# Patient Record
Sex: Male | Born: 1995 | Race: White | Hispanic: No | Marital: Single | Smoking: Never smoker
Health system: Southern US, Community
[De-identification: ages and names within clinical notes are randomized; demographics above are authoritative.]

## PROBLEM LIST (undated history)

## (undated) DIAGNOSIS — E063 Autoimmune thyroiditis: Secondary | ICD-10-CM

## (undated) HISTORY — PX: HERNIA REPAIR: SHX51

## (undated) HISTORY — PX: INGUINAL HERNIA REPAIR: SUR1180

## (undated) HISTORY — PX: UMBILICAL HERNIA REPAIR: SHX196

---
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
## 2021-04-07 ENCOUNTER — Emergency Department (HOSPITAL_BASED_OUTPATIENT_CLINIC_OR_DEPARTMENT_OTHER)
Admission: EM | Admit: 2021-04-07 | Discharge: 2021-04-07 | Disposition: A | Discharge status: Home / Self Care | Payer: Commercial Managed Care - HMO | Attending: Emergency Medicine | Admitting: Emergency Medicine

## 2021-04-07 ENCOUNTER — Emergency Department (HOSPITAL_BASED_OUTPATIENT_CLINIC_OR_DEPARTMENT_OTHER): Payer: Commercial Managed Care - HMO

## 2021-04-07 ENCOUNTER — Other Ambulatory Visit: Payer: Self-pay

## 2021-04-07 ENCOUNTER — Encounter (HOSPITAL_BASED_OUTPATIENT_CLINIC_OR_DEPARTMENT_OTHER): Payer: Self-pay | Admitting: Emergency Medicine

## 2021-04-07 DIAGNOSIS — J45909 Unspecified asthma, uncomplicated: Secondary | ICD-10-CM | POA: Insufficient documentation

## 2021-04-07 DIAGNOSIS — R9431 Abnormal electrocardiogram [ECG] [EKG]: Secondary | ICD-10-CM | POA: Diagnosis not present

## 2021-04-07 DIAGNOSIS — Z20822 Contact with and (suspected) exposure to covid-19: Secondary | ICD-10-CM | POA: Diagnosis not present

## 2021-04-07 DIAGNOSIS — R0789 Other chest pain: Secondary | ICD-10-CM | POA: Insufficient documentation

## 2021-04-07 HISTORY — DX: Autoimmune thyroiditis: E06.3

## 2021-04-07 LAB — COMPREHENSIVE METABOLIC PANEL
ALT: 18 U/L (ref 0–44)
AST: 22 U/L (ref 15–41)
Albumin: 4.9 g/dL (ref 3.5–5.0)
Alkaline Phosphatase: 63 U/L (ref 38–126)
Anion gap: 10 (ref 5–15)
BUN: 17 mg/dL (ref 6–20)
CO2: 25 mmol/L (ref 22–32)
Calcium: 9.4 mg/dL (ref 8.9–10.3)
Chloride: 102 mmol/L (ref 98–111)
Creatinine, Ser: 1.03 mg/dL (ref 0.61–1.24)
GFR, Estimated: >60 mL/min (ref >60)
Glucose, Bld: 120 mg/dL — ABNORMAL HIGH (ref 70–99)
Potassium: 3.2 mmol/L — ABNORMAL LOW (ref 3.5–5.1)
Sodium: 137 mmol/L (ref 135–145)
Total Bilirubin: 0.9 mg/dL (ref 0.3–1.2)
Total Protein: 8.4 g/dL — ABNORMAL HIGH (ref 6.5–8.1)

## 2021-04-07 LAB — CBC WITH DIFFERENTIAL/PLATELET
Abs Immature Granulocytes: 0.02 10*3/uL (ref 0.00–0.07)
Basophils Absolute: 0 10*3/uL (ref 0.0–0.1)
Basophils Relative: 1 %
Eosinophils Absolute: 0.2 10*3/uL (ref 0.0–0.5)
Eosinophils Relative: 4 %
HCT: 45.5 % (ref 39.0–52.0)
Hemoglobin: 16.1 g/dL (ref 13.0–17.0)
Immature Granulocytes: 0 %
Lymphocytes Relative: 27 %
Lymphs Abs: 1.7 10*3/uL (ref 0.7–4.0)
MCH: 29.8 pg (ref 26.0–34.0)
MCHC: 35.4 g/dL (ref 30.0–36.0)
MCV: 84.1 fL (ref 80.0–100.0)
Monocytes Absolute: 0.6 10*3/uL (ref 0.1–1.0)
Monocytes Relative: 9 %
Neutro Abs: 3.8 10*3/uL (ref 1.7–7.7)
Neutrophils Relative %: 59 %
Platelets: 205 10*3/uL (ref 150–400)
RBC: 5.41 MIL/uL (ref 4.22–5.81)
RDW: 11.9 % (ref 11.5–15.5)
WBC: 6.4 10*3/uL (ref 4.0–10.5)
nRBC: 0 % (ref 0.0–0.2)

## 2021-04-07 LAB — RESP PANEL BY RT-PCR (FLU A&B, COVID) ARPGX2
Influenza A by PCR: NEGATIVE
Influenza B by PCR: NEGATIVE
SARS Coronavirus 2 by RT PCR: NEGATIVE

## 2021-04-07 LAB — TSH: TSH: 2.057 u[IU]/mL (ref 0.350–4.500)

## 2021-04-07 LAB — TROPONIN I (HIGH SENSITIVITY): Troponin I (High Sensitivity): 3 ng/L (ref <18)

## 2021-04-07 NOTE — Discharge Instructions (Signed)
Your work-up today was reassuring, no evidence of pneumonia or heart attack.  You should continue taking the Pepcid to see if that helps alleviate the symptoms, you can also take Tylenol.  Avoid any anti-inflammatory use as this can irritate the esophagus and may be reproducing your symptoms.  Follow-up with your primary care doctor next week for reevaluation.  Check MyChart for the result of COVID and flu.

## 2021-04-07 NOTE — ED Triage Notes (Signed)
Pt reports CP since Wed intermittently; sts now it feels like someone standing on his chest

## 2021-04-07 NOTE — ED Notes (Signed)
ED Provider at bedside. 

## 2021-04-07 NOTE — ED Provider Notes (Addendum)
[facility] EMERGENCY DEPARTMENT Provider Note   CSN: [id number] Arrival date & time: 04/07/21  1628     History  Chief Complaint  Patient presents with   Chest Pain    Nathan Beltran is a 26 y.o. male.   Chest Pain Associated symptoms: no back pain, no fever, no nausea and no vomiting   This is a 26 year old male with history of Hashimoto's and childhood asthma presenting due to chest pain.  Started a week ago, initially was intermittent but is been constant the last few days.  It feels like burning, unable to identify provoking features.  Does not radiate elsewhere, no associated nausea or vomiting or shortness of breath.  Did have a URI about a week before the symptoms started.  Has tried Pepcid which has not alleviated the symptoms.  Denies smoking cigarettes or vaping, no first-degree family members with CAD, not hypertensive, hyperlipidemia, no prior MIs or PEs.    Home Medications Prior to Admission medications   Medication Sig Start Date End Date Taking? Authorizing Provider  Budesonide (PULMICORT IN) Inhale into the lungs.      [provider]  Methylphenidate HCl (RITALIN PO) Take by mouth.      [provider]      Allergies    Penicillins    Review of Systems   Review of Systems  Constitutional:  Negative for fever.  Respiratory:  Negative for wheezing.   Cardiovascular:  Positive for chest pain.  Gastrointestinal:  Negative for nausea and vomiting.  Musculoskeletal:  Negative for back pain.  Skin:  Negative for wound.   Physical Exam Updated Vital Signs BP 134/68 (BP Location: Right Arm)    Pulse 78    Temp 98.4 F (36.9 C) (Oral)    Resp 16    Ht 5\' 5"  (1.651 m)    Wt 65.3 kg    SpO2 100%    BMI 23.96 kg/m  Physical Exam Vitals and nursing note reviewed. Exam conducted with a chaperone present.  Constitutional:      General: He is not in acute distress.    Appearance: Normal appearance.  HENT:     Head: Normocephalic and  atraumatic.  Eyes:     General: No scleral icterus.       Right eye: No discharge.        Left eye: No discharge.     Extraocular Movements: Extraocular movements intact.     Pupils: Pupils are equal, round, and reactive to light.  Cardiovascular:     Rate and Rhythm: Normal rate and regular rhythm.     Pulses: Normal pulses.     Heart sounds: Normal heart sounds. No murmur heard.   No friction rub. No gallop.  Pulmonary:     Effort: Pulmonary effort is normal. No respiratory distress.     Breath sounds: Normal breath sounds.  Chest:     Chest wall: No tenderness.  Abdominal:     General: Abdomen is flat. Bowel sounds are normal. There is no distension.     Palpations: Abdomen is soft.     Tenderness: There is no abdominal tenderness.  Skin:    General: Skin is warm and dry.     Coloration: Skin is not jaundiced.  Neurological:     Mental Status: He is alert. Mental status is at baseline.     Coordination: Coordination normal.    ED Results / Procedures / Treatments   Labs (all labs ordered are listed,  but only abnormal results are displayed) Labs Reviewed  COMPREHENSIVE METABOLIC PANEL - Abnormal; Notable for the following components:      Result Value   Potassium 3.2 (*)    Glucose, Bld 120 (*)    Total Protein 8.4 (*)    All other components within normal limits  RESP PANEL BY RT-PCR (FLU A&B, COVID) ARPGX2  CBC WITH DIFFERENTIAL/PLATELET  TSH  TROPONIN I (HIGH SENSITIVITY)    EKG EKG Interpretation  Date/Time:  Saturday April 07 2021 16:40:22 EST Ventricular Rate:  76 PR Interval:  146 QRS Duration: 90 QT Interval:  384 QTC Calculation: 432 R Axis:   99 Text Interpretation: Normal sinus rhythm with sinus arrhythmia Right atrial enlargement Rightward axis Pulmonary disease pattern Abnormal ECG When compared with ECG of 15-Jul-2006 15:42,  no significant change Confirmed by Meridee Score [phone]) on 04/07/2021 4:43:35 PM  Radiology DG Chest 2  View  Result Date: 04/07/2021 CLINICAL DATA:  Chest pain for 4 days. EXAM: CHEST - 2 VIEW COMPARISON:  None. FINDINGS: The heart size and mediastinal contours are within normal limits. Both lungs are clear. No evidence of pneumothorax or pleural effusion. The visualized skeletal structures are unremarkable. IMPRESSION: Normal exam. Electronically Signed   By: Danae Orleans M.D.   On: 04/07/2021 19:00    Procedures Procedures    Medications Ordered in ED Medications - No data to display  ED Course/ Medical Decision Making/ A&P                           Medical Decision Making Amount and/or Complexity of Data Reviewed Independent Historian: parent    Details: parent at bedside External Data Reviewed: ECG.    Details: nsr Labs:  Decision-making details documented in ED Course. Radiology:  Decision-making details documented in ED Course. ECG/medicine tests:  Decision-making details documented in ED Course.  Risk OTC drugs. Prescription drug management.   Patient is a 26 year old male presenting due to chest pain x1 week.  His vitals are stable, not febrile no tachycardia, tachypnea or hypoxia.  Physical exam is unremarkable.  EKG unchanged compared to previous, no ischemic findings.  PERC negative. Troponin low, doubt this is ACS and do not feel we need a second troponin.  Respiratory panel does not show any signs of flu or COVID, no leukocytosis or anemia.  BMP notable for mild hypokalemia at 3.2, not contributory to his current presentation.  Patient's mother requested TSH, this was ordered in triage.  Do not think his presentation is related to distant history of Hashimoto's, advised to check on MyChart for the results.  Do not think patient needs additional work-up at this time.  His pain sounds somewhat like reflux disease despite no improvement with Pepcid x1 week.  Does not appear to be cardiac in nature, doubt any infectious or emergent pathology at this time.  Return precautions  discussed, discharged in stable condition.  Advised to follow-up with PCP.        Final Clinical Impression(s) / ED Diagnoses Final diagnoses:  Atypical chest pain    Rx / DC Orders ED Discharge Orders     None         Theron Arista, PA-C 04/08/21 0000    Theron Arista, PA-C 04/08/21 0000    Terrilee Files, MD 04/08/21 1016

## 2021-04-24 ENCOUNTER — Ambulatory Visit: Payer: Managed Care, Other (non HMO) | Admitting: Internal Medicine

## 2021-04-24 ENCOUNTER — Other Ambulatory Visit: Payer: Self-pay

## 2021-04-24 VITALS — BP 122/70 | HR 86 | Ht 65.0 in | Wt 142.0 lb

## 2021-04-24 DIAGNOSIS — R0789 Other chest pain: Secondary | ICD-10-CM

## 2021-04-24 NOTE — Progress Notes (Signed)
Cardiology Office Note:    Date:  04/24/2021   ID:  Nathan Beltran, DOB [birth year], MRN [id number]  PCP:  Everrett Coombe, DO   [facility] Providers Cardiologist:  Maisie Fus, MD     Referring MD: No ref. provider found   Chief Complaint  Patient presents with   New Patient (Initial Visit)       Atypical cp  History of Present Illness:    Nathan Beltran is a 26 y.o. male with a hx of hashimoto thyroiditis, childhood asthma, seen in the ED for burning chest pain diagnosed with reflux, ACS was ruled out, cardiology referral was sent  Today he reports, he said he had a cold. This resolved. He ate food and had a sharp pain. He was diagnosed with reflux. He took pepcid [facility] which did not help. His mother thinks this could be MSK related with physical labor during the day. It has resolved.  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 No hx of congential heart disease.  He had asthma but this has improved overtime. He denies LH, dizziness or syncope.  Social Hx: non smoker  Family hx-MGM CHF in the 51s. Son had heart transplant at 86 2/2 ischemic heart disease. Mother and father are healthy. MGM has arrhythmia in her 61s.  EKG: NSR, right axis, RAE (c/f possible lead reversal)  Past Medical History:  Diagnosis Date   Hashimoto's disease     Past Surgical History:  Procedure Laterality Date   HERNIA REPAIR      Current Medications: No outpatient medications have been marked as taking for the 04/24/21 encounter (Office Visit) with Maisie Fus, MD.     Allergies:   Penicillins   Social History   Socioeconomic History   Marital status: Single    Spouse name: Not on file   Number of children: Not on file   Years of education: Not on file   Highest education level: Not on file  Occupational History   Not on file  Tobacco Use   Smoking status: Never   Smokeless tobacco: Never  Substance and Sexual Activity   Alcohol use: Not Currently   Drug use: Not Currently   Sexual activity:  Not on file  Other Topics Concern   Not on file  Social History Narrative   Not on file   Social Determinants of Health   Financial Resource Strain: Not on file  Food Insecurity: Not on file  Transportation Needs: Not on file  Physical Activity: Not on file  Stress: Not on file  Social Connections: Not on file     Family History: The patient's per above  ROS:   Please see the history of present illness.     All other systems reviewed and are negative.  EKGs/Labs/Other Studies Reviewed:    The following studies were reviewed today:   EKG:  EKG is  ordered today.  The ekg ordered today demonstrates   NSR, normal intervals, normal QTc  Recent Labs: 04/07/2021: ALT 18; BUN 17; Creatinine, Ser 1.03; Hemoglobin 16.1; Platelets 205; Potassium 3.2; Sodium 137; TSH 2.057  Recent Lipid Panel No results found for: CHOL, TRIG, HDL, CHOLHDL, VLDL, LDLCALC, LDLDIRECT   Risk Assessment/Calculations:           Physical Exam:    VS:    Vitals:   04/24/21 1545  BP: 122/70  Pulse: 86     Wt Readings from Last 3 Encounters:  04/24/21 142 lb (64.4 kg)  04/07/21 144  lb (65.3 kg)     GEN:  Well nourished, well developed in no acute distress HEENT: Normal NECK: No JVD; No carotid bruits LYMPHATICS: No lymphadenopathy CARDIAC: RRR, no murmurs, rubs, gallops RESPIRATORY:  Clear to auscultation without rales, wheezing or rhonchi  ABDOMEN: Soft, non-tender, non-distended MUSCULOSKELETAL:  No edema; No deformity  SKIN: Warm and dry NEUROLOGIC:  Alert and oriented x 3 PSYCHIATRIC:  Normal affect   ASSESSMENT:    #Atypical CP: Ddx includes MSK. He notes pepcid did not help. He does pulling motion during the day. He is very low risk for cardiac disease.   PLAN:    In order of problems listed above:  Follow up PRN           Medication Adjustments/Labs and Tests Ordered: Current medicines are reviewed at length with the patient today.  Concerns regarding medicines  are outlined above.  No orders of the defined types were placed in this encounter.  No orders of the defined types were placed in this encounter.   Patient Instructions  Medication Instructions:  No Changes In Medications at this time.  *If you need a refill on your cardiac medications before your next appointment, please call your pharmacy*  Follow-Up: At [facility], you and your health needs are our priority.  As part of our continuing mission to provide you with exceptional heart care, we have created designated Provider Care Teams.  These Care Teams include your primary Cardiologist (physician) and Advanced Practice Providers (APPs -  Physician Assistants and Nurse Practitioners) who all work together to provide you with the care you need, when you need it.  Your next appointment:   AS NEEDED   The format for your next appointment:   In Person  Provider:   Maisie Fus, MD      Signed, Maisie Fus, MD  04/24/2021 4:15 PM    [facility]

## 2021-04-24 NOTE — Patient Instructions (Signed)

## 2022-02-06 ENCOUNTER — Encounter (HOSPITAL_BASED_OUTPATIENT_CLINIC_OR_DEPARTMENT_OTHER): Payer: Self-pay | Admitting: Emergency Medicine

## 2022-02-06 ENCOUNTER — Emergency Department (HOSPITAL_BASED_OUTPATIENT_CLINIC_OR_DEPARTMENT_OTHER)
Admission: EM | Admit: 2022-02-06 | Discharge: 2022-02-07 | Disposition: A | Discharge status: Home / Self Care | Payer: Commercial Managed Care - HMO | Attending: Emergency Medicine | Admitting: Emergency Medicine

## 2022-02-06 ENCOUNTER — Other Ambulatory Visit: Payer: Self-pay

## 2022-02-06 DIAGNOSIS — R109 Unspecified abdominal pain: Secondary | ICD-10-CM | POA: Diagnosis present

## 2022-02-06 DIAGNOSIS — R1084 Generalized abdominal pain: Secondary | ICD-10-CM | POA: Diagnosis not present

## 2022-02-06 DIAGNOSIS — E860 Dehydration: Secondary | ICD-10-CM | POA: Insufficient documentation

## 2022-02-06 LAB — CBC
HCT: 44.5 % (ref 39.0–52.0)
Hemoglobin: 15.9 g/dL (ref 13.0–17.0)
MCH: 29.9 pg (ref 26.0–34.0)
MCHC: 35.7 g/dL (ref 30.0–36.0)
MCV: 83.8 fL (ref 80.0–100.0)
Platelets: 204 10*3/uL (ref 150–400)
RBC: 5.31 MIL/uL (ref 4.22–5.81)
RDW: 12 % (ref 11.5–15.5)
WBC: 10.7 10*3/uL — ABNORMAL HIGH (ref 4.0–10.5)
nRBC: 0 % (ref 0.0–0.2)

## 2022-02-06 LAB — COMPREHENSIVE METABOLIC PANEL
ALT: 17 U/L (ref 0–44)
AST: 20 U/L (ref 15–41)
Albumin: 4.9 g/dL (ref 3.5–5.0)
Alkaline Phosphatase: 48 U/L (ref 38–126)
Anion gap: 13 (ref 5–15)
BUN: 19 mg/dL (ref 6–20)
CO2: 22 mmol/L (ref 22–32)
Calcium: 9.6 mg/dL (ref 8.9–10.3)
Chloride: 100 mmol/L (ref 98–111)
Creatinine, Ser: 1.08 mg/dL (ref 0.61–1.24)
GFR, Estimated: >60 mL/min (ref >60)
Glucose, Bld: 141 mg/dL — ABNORMAL HIGH (ref 70–99)
Potassium: 3.9 mmol/L (ref 3.5–5.1)
Sodium: 135 mmol/L (ref 135–145)
Total Bilirubin: 1.4 mg/dL — ABNORMAL HIGH (ref 0.3–1.2)
Total Protein: 7.9 g/dL (ref 6.5–8.1)

## 2022-02-06 LAB — LIPASE, BLOOD: Lipase: 18 U/L (ref 11–51)

## 2022-02-06 MED ORDER — ONDANSETRON 4 MG PO TBDP
4.0000 mg | ORAL_TABLET | Freq: Once | ORAL | Status: AC
Start: 2022-02-06 — End: 2022-02-06
  Administered 2022-02-06: 4 mg via ORAL
  Filled 2022-02-06: qty 1

## 2022-02-06 NOTE — ED Triage Notes (Signed)
Pt arrives pov, steady gait, c/o epigastric pain, bilateral leg tingling x 1 year. Endorses n/v today, denies fever

## 2022-02-07 ENCOUNTER — Emergency Department (HOSPITAL_BASED_OUTPATIENT_CLINIC_OR_DEPARTMENT_OTHER): Payer: Commercial Managed Care - HMO

## 2022-02-07 LAB — URINALYSIS, ROUTINE W REFLEX MICROSCOPIC
Bilirubin Urine: NEGATIVE
Glucose, UA: NEGATIVE mg/dL
Hgb urine dipstick: NEGATIVE
Ketones, ur: >80 mg/dL — AB
Leukocytes,Ua: NEGATIVE
Nitrite: NEGATIVE
Specific Gravity, Urine: 1.034 — ABNORMAL HIGH (ref 1.005–1.030)
pH: 6 (ref 5.0–8.0)

## 2022-02-07 LAB — TSH: TSH: 0.943 u[IU]/mL (ref 0.350–4.500)

## 2022-02-07 MED ORDER — ONDANSETRON HCL 4 MG/2ML IJ SOLN
4.0000 mg | Freq: Once | INTRAMUSCULAR | Status: AC
  Administered 2022-02-07: 4 mg via INTRAVENOUS
  Filled 2022-02-07: qty 2

## 2022-02-07 MED ORDER — FENTANYL CITRATE PF 50 MCG/ML IJ SOSY
50.0000 ug | PREFILLED_SYRINGE | Freq: Once | INTRAMUSCULAR | Status: AC
  Administered 2022-02-07: 50 ug via INTRAVENOUS
  Filled 2022-02-07: qty 1

## 2022-02-07 MED ORDER — IOHEXOL 300 MG/ML  SOLN
100.0000 mL | Freq: Once | INTRAMUSCULAR | Status: AC | PRN
  Administered 2022-02-07: 80 mL via INTRAVENOUS

## 2022-02-07 MED ORDER — SODIUM CHLORIDE 0.9 % IV BOLUS
1000.0000 mL | Freq: Once | INTRAVENOUS | Status: AC
  Administered 2022-02-07: 1000 mL via INTRAVENOUS

## 2022-02-07 NOTE — ED Provider Notes (Signed)
[facility] EMERGENCY DEPT Provider Note   CSN: [id number] Arrival date & time: 02/06/22  1743     History  Chief Complaint  Patient presents with   Abdominal Pain    Nathan Beltran is a 26 y.o. male.  The history is provided by the patient and a parent.  Patient presents for multiple complaints.  He reports he has had symptoms ongoing for months.  He reports episodes of fatigue and decreased appetite.  He reports he has lost 8 pounds in a year.  He reports frequent episodes of dizziness.  He reports frequent episodes of heartburn and abdominal pain.  This current episode started about 6 days ago.  He reports having dizziness and fatigue, then began having heartburn and abdominal pain.  He reports vomiting and diarrhea.  No fevers. He reports he has Hashimoto's thyroiditis but is not on medications.  He is scheduled to see a PCP later this year     Home Medications Prior to Admission medications   Not on File      Allergies    Penicillins    Review of Systems   Review of Systems  Constitutional:  Positive for fatigue.  Gastrointestinal:  Positive for diarrhea, nausea and vomiting. Negative for blood in stool.    Physical Exam Updated Vital Signs BP (!) 105/50   Pulse 76   Temp 98.7 F (37.1 C) (Oral)   Resp 18   Ht 1.651 m (5\' 5" )   Wt 60.3 kg   SpO2 100%   BMI 22.13 kg/m  Physical Exam CONSTITUTIONAL: Well developed/well nourished HEAD: Normocephalic/atraumatic EYES: EOMI/PERRL, no icterus ENMT: Mucous membranes moist NECK: supple no meningeal signs SPINE/BACK:entire spine nontender CV: S1/S2 noted, no murmurs/rubs/gallops noted LUNGS: Lungs are clear to auscultation bilaterally, no apparent distress ABDOMEN: soft, mild diffuse tenderness, no rebound or guarding, bowel sounds noted throughout abdomen GU:no cva tenderness NEURO: Pt is awake/alert/appropriate, moves all extremitiesx4.  No facial droop.   EXTREMITIES: pulses normal/equal, full  ROM SKIN: warm, color normal PSYCH: no abnormalities of mood noted, alert and oriented to situation  ED Results / Procedures / Treatments   Labs (all labs ordered are listed, but only abnormal results are displayed) Labs Reviewed  COMPREHENSIVE METABOLIC PANEL - Abnormal; Notable for the following components:      Result Value   Glucose, Bld 141 (*)    Total Bilirubin 1.4 (*)    All other components within normal limits  CBC - Abnormal; Notable for the following components:   WBC 10.7 (*)    All other components within normal limits  URINALYSIS, ROUTINE W REFLEX MICROSCOPIC - Abnormal; Notable for the following components:   Specific Gravity, Urine 1.034 (*)    Ketones, ur >80 (*)    Protein, ur TRACE (*)    All other components within normal limits  LIPASE, BLOOD  TSH    EKG EKG Interpretation  Date/Time:  Wednesday February 06 2022 18:41:18 EST Ventricular Rate:  52 PR Interval:  96 QRS Duration: 86 QT Interval:  410 QTC Calculation: 381 R Axis:   94 Text Interpretation: Sinus bradycardia with short PR Rightward axis Borderline ECG Interpretation limited secondary to artifact Confirmed by 10-07-1992 (Zadie Rhine) on 02/07/2022 12:22:18 AM  Radiology CT ABDOMEN PELVIS W CONTRAST  Result Date: 02/07/2022 CLINICAL DATA:  Abdominal pain, acute, nonlocalized. Nausea vomiting EXAM: CT ABDOMEN AND PELVIS WITH CONTRAST TECHNIQUE: Multidetector CT imaging of the abdomen and pelvis was performed using the standard protocol following bolus administration  of intravenous contrast. RADIATION DOSE REDUCTION: This exam was performed according to the departmental dose-optimization program which includes automated exposure control, adjustment of the mA and/or kV according to patient size and/or use of iterative reconstruction technique. CONTRAST:  77mL OMNIPAQUE IOHEXOL 300 MG/ML  SOLN COMPARISON:  None Available. FINDINGS: Lower chest: No acute abnormality. Hepatobiliary: No focal liver  abnormality. No gallstones, gallbladder wall thickening, or pericholecystic fluid. No biliary dilatation. Pancreas: No focal lesion. Normal pancreatic contour. No surrounding inflammatory changes. No main pancreatic ductal dilatation. Spleen: Normal in size without focal abnormality. Adrenals/Urinary Tract: No adrenal nodule bilaterally. Bilateral kidneys enhance symmetrically. No hydronephrosis. No hydroureter. The urinary bladder is unremarkable. Stomach/Bowel: Stomach is within normal limits. No evidence of bowel wall thickening or dilatation. Appendix appears normal (2:48, 5:45). Vascular/Lymphatic: No abdominal aorta or iliac aneurysm. Mild atherosclerotic plaque of the aorta and its branches. No abdominal, pelvic, or inguinal lymphadenopathy. Reproductive: Prostate is unremarkable. Other: No intraperitoneal free fluid. No intraperitoneal free gas. No organized fluid collection. Musculoskeletal: No abdominal wall hernia or abnormality. No suspicious lytic or blastic osseous lesions. No acute displaced fracture. IMPRESSION: No acute intra-abdominal or intrapelvic abnormality. Electronically Signed   By: Tish Frederickson M.D.   On: 02/07/2022 01:42    Procedures Procedures    Medications Ordered in ED Medications  ondansetron (ZOFRAN-ODT) disintegrating tablet 4 mg (4 mg Oral Given 02/06/22 1849)  sodium chloride 0.9 % bolus 1,000 mL (1,000 mLs Intravenous New Bag/Given 02/07/22 0047)  ondansetron (ZOFRAN) injection 4 mg (4 mg Intravenous Given 02/07/22 0047)  fentaNYL (SUBLIMAZE) injection 50 mcg (50 mcg Intravenous Given 02/07/22 0047)  iohexol (OMNIPAQUE) 300 MG/ML solution 100 mL (80 mLs Intravenous Contrast Given 02/07/22 0128)    ED Course/ Medical Decision Making/ A&P Clinical Course as of 02/07/22 0253  Thu Feb 07, 2022  0217 Patient had his episodes ongoing for months.  This episode started several days ago.  He is in no distress.  Patient felt that his CT scan should be done to evaluate  for any acute issues.  CT scan was negative.  Patient given IV fluids.  He is safe for discharge and will be discharged home. [DW]    Clinical Course User Index [DW] Zadie Rhine, MD                           Medical Decision Making Amount and/or Complexity of Data Reviewed Labs: ordered. Radiology: ordered.  Risk Prescription drug management.   This patient presents to the ED for concern of abdominal pain, this involves an extensive number of treatment options, and is a complaint that carries with it a high risk of complications and morbidity.  The differential diagnosis includes but is not limited to cholecystitis, cholelithiasis, pancreatitis, gastritis, peptic ulcer disease, appendicitis, bowel obstruction, bowel perforation, diverticulitis, AAA, ischemic bowel   Social Determinants of Health: Patient's impaired access to primary care  increases the complexity of managing their presentation  Additional history obtained: Additional history obtained from family Records reviewed  cardiology notes reviewed  Lab Tests: I Ordered, and personally interpreted labs.  The pertinent results include: Dehydration  Imaging Studies ordered: I ordered imaging studies including CT scan abdomen pelvis   I independently visualized and interpreted imaging which showed no acute findings I agree with the radiologist interpretation  Cardiac Monitoring: The patient was maintained on a cardiac monitor.  I personally viewed and interpreted the cardiac monitor which showed an underlying rhythm of:  sinus  rhythm  Medicines ordered and prescription drug management: I ordered medication including Zofran and fentanyl for pain Reevaluation of the patient after these medicines showed that the patient    improved  Reevaluation: After the interventions noted above, I reevaluated the patient and found that they have :improved  Complexity of problems addressed: Patient's presentation is most consistent  with  acute presentation with potential threat to life or bodily function  Disposition: After consideration of the diagnostic results and the patient's response to treatment,  I feel that the patent would benefit from discharge   .           Final Clinical Impression(s) / ED Diagnoses Final diagnoses:  Generalized abdominal pain    Rx / DC Orders ED Discharge Orders     None         Zadie Rhine, MD 02/07/22 [phone]

## 2022-02-07 NOTE — Discharge Instructions (Signed)

## 2022-02-12 ENCOUNTER — Ambulatory Visit: Payer: Commercial Managed Care - HMO | Admitting: Physician Assistant

## 2022-02-12 ENCOUNTER — Other Ambulatory Visit: Payer: Commercial Managed Care - HMO

## 2022-02-12 ENCOUNTER — Encounter: Payer: Self-pay | Admitting: Physician Assistant

## 2022-02-12 VITALS — BP 112/62 | HR 86 | Ht 65.0 in | Wt 133.0 lb

## 2022-02-12 DIAGNOSIS — R634 Abnormal weight loss: Secondary | ICD-10-CM

## 2022-02-12 DIAGNOSIS — R1013 Epigastric pain: Secondary | ICD-10-CM

## 2022-02-12 DIAGNOSIS — R112 Nausea with vomiting, unspecified: Secondary | ICD-10-CM | POA: Diagnosis not present

## 2022-02-12 DIAGNOSIS — R5383 Other fatigue: Secondary | ICD-10-CM

## 2022-02-12 DIAGNOSIS — R194 Change in bowel habit: Secondary | ICD-10-CM | POA: Diagnosis not present

## 2022-02-12 MED ORDER — PANTOPRAZOLE SODIUM 40 MG PO TBEC: 40.0000 mg | DELAYED_RELEASE_TABLET | Freq: Every day | ORAL | 5 refills | Status: DC

## 2022-02-12 NOTE — Patient Instructions (Signed)
We have sent the following medications to your pharmacy for you to pick up at your convenience: Pantoprazole 40 mg daily 30-60 minutes before breakfast, start after completing stool study.   Your provider has requested that you go to the basement level for lab work before leaving today. Press "B" on the elevator. The lab is located at the first door on the left as you exit the elevator.  You have been scheduled for an abdominal ultrasound at [facility] (1st floor of hospital) on Thursday 02/21/22 at 10 am. Please arrive 30 minutes prior to your appointment for registration. Make certain not to have anything to eat or drink 6 hours prior to your appointment. Should you need to reschedule your appointment, please contact radiology at [phone]. This test typically takes about 30 minutes to perform.  _______________________________________________________  If you are age 55 or older, your body mass index should be between 23-30. Your Body mass index is 22.13 kg/m. If this is out of the aforementioned range listed, please consider follow up with your Primary Care Provider.  If you are age 52 or younger, your body mass index should be between 19-25. Your Body mass index is 22.13 kg/m. If this is out of the aformentioned range listed, please consider follow up with your Primary Care Provider.   ________________________________________________________  The [facility] providers would like to encourage you to use [facility] to communicate with providers for non-urgent requests or questions.  Due to long hold times on the telephone, sending your provider a message by [facility] may be a faster and more efficient way to get a response.  Please allow 48 business hours for a response.  Please remember that this is for non-urgent requests.  _______________________________________________________

## 2022-02-12 NOTE — Progress Notes (Signed)
Chief Complaint: Abdominal pain, nausea, vomiting, fatigue, GERD  HPI:    Nathan Beltran is a 26 year old Caucasian male with a past medical history as listed below, who presents to clinic today accompanied by his dad who does assist with history for complaint of pain, nausea, vomiting, fatigue and GERD.    02/06/2022 patient seen in the ER for ongoing symptoms for months of fatigue, decreased appetite and frequent episodes of dizziness as well as heartburn and abdominal pain with nausea and some vomiting.  CMP with a total bili 1.4 and glucose 141 otherwise normal.  CBC with a white count of 10.7, urinalysis with trace protein and greater than 80 ketones, lipase and TSH normal.  CTAP without contrast showed no acute intra-abdominal or intrapelvic abnormality.    Today, the patient tells me that he has had 2 episodes that last for 13 to 15 days of epigastric/generalized abdominal pain, worsening fatigue, nausea, decreased appetite and occasional episodes of vomiting as well as reflux and occasional diarrhea that is sometimes pale in appearance.  He has lost about 9 pounds over the past year without really trying.  His dad tells me that he seems "depressed", just laying around on the couch and then just going to bed.  Apparently he tried Omeprazole 20 mg for 3 days but this did not help with symptoms.  He also took some online supplement for a while after his first episode which he felt got his bowel movements back to normal but he has discontinued this since then.    Patient works in [employer] and wonders if breathing in all the chemicals/pet dander etc has made him sick.  Admits to increased anxiety this year.  That tells me that he seemed to develop all of the symptoms after eating some fish out of the beach over the summer.  Also describes that he had COVID previously and wonders if that could have affected things.    Denies fever, chills, blood in his stool or symptoms that awaken him from  sleep.  Past Medical History:  Diagnosis Date   Hashimoto's disease     Past Surgical History:  Procedure Laterality Date   HERNIA REPAIR     UMBILICAL HERNIA REPAIR      Current Outpatient Medications  Medication Sig Dispense Refill   pantoprazole (PROTONIX) 40 MG tablet Take 1 tablet (40 mg total) by mouth daily. 30 tablet 5   No current facility-administered medications for this visit.    Allergies as of 02/12/2022 - Review Complete 02/12/2022  Allergen Reaction Noted   Penicillins  09/07/2010    Family History: Paternal Grandmother with previous colon surgery for unknown cause  Social History   Socioeconomic History   Marital status: Single    Spouse name: Not on file   Number of children: Not on file   Years of education: Not on file   Highest education level: Not on file  Occupational History   Not on file  Tobacco Use   Smoking status: Never   Smokeless tobacco: Never  Vaping Use   Vaping Use: Never used  Substance and Sexual Activity   Alcohol use: Never   Drug use: Never   Sexual activity: Not on file  Other Topics Concern   Not on file  Social History Narrative   Not on file   Social Determinants of Health   Financial Resource Strain: Not on file  Food Insecurity: Not on file  Transportation Needs: Not on file  Physical Activity:  Not on file  Stress: Not on file  Social Connections: Not on file  Intimate Partner Violence: Not on file    Review of Systems:    Constitutional: No fever or chills Skin: No rash  Cardiovascular: No chest pain  Respiratory: No SOB Gastrointestinal: See HPI and otherwise negative Genitourinary: No dysuria Neurological: No headache, dizziness or syncope Musculoskeletal: No new muscle or joint pain Hematologic: No bleeding  Psychiatric: +anxiety   Physical Exam:  Vital signs: BP 112/62   Pulse 86   Ht 5\' 5"  (1.651 m)   Wt 133 lb (60.3 kg)   BMI 22.13 kg/m    Constitutional:   Pleasant Caucasian male  appears to be in NAD, Well developed, Well nourished, alert and cooperative Head:  Normocephalic and atraumatic. Eyes:   PEERL, EOMI. No icterus. Conjunctiva pink. Ears:  Normal auditory acuity. Neck:  Supple Throat: Oral cavity and pharynx without inflammation, swelling or lesion.  Respiratory: Respirations even and unlabored. Lungs clear to auscultation bilaterally.   No wheezes, crackles, or rhonchi.  Cardiovascular: Normal S1, S2. No MRG. Regular rate and rhythm. No peripheral edema, cyanosis or pallor.  Gastrointestinal:  Soft, nondistended, mild epigastric ttp. No rebound or guarding. Normal bowel sounds. No appreciable masses or hepatomegaly. Rectal:  Not performed.  Msk:  Symmetrical without gross deformities. Without edema, no deformity or joint abnormality.  Neurologic:  Alert and  oriented x4;  grossly normal neurologically.  Skin:   Dry and intact without significant lesions or rashes. Psychiatric: Demonstrates good judgement and reason without abnormal affect or behaviors.  RELEVANT LABS AND IMAGING: CBC    Component Value Date/Time   WBC 10.7 (H) 02/06/2022 2143   RBC 5.31 02/06/2022 2143   HGB 15.9 02/06/2022 2143   HCT 44.5 02/06/2022 2143   PLT 204 02/06/2022 2143   MCV 83.8 02/06/2022 2143   MCH 29.9 02/06/2022 2143   MCHC 35.7 02/06/2022 2143   RDW 12.0 02/06/2022 2143   LYMPHSABS 1.7 04/07/2021 1650   MONOABS 0.6 04/07/2021 1650   EOSABS 0.2 04/07/2021 1650   BASOSABS 0.0 04/07/2021 1650    CMP     Component Value Date/Time   NA 135 02/06/2022 2143   K 3.9 02/06/2022 2143   CL 100 02/06/2022 2143   CO2 22 02/06/2022 2143   GLUCOSE 141 (H) 02/06/2022 2143   BUN 19 02/06/2022 2143   CREATININE 1.08 02/06/2022 2143   CALCIUM 9.6 02/06/2022 2143   PROT 7.9 02/06/2022 2143   ALBUMIN 4.9 02/06/2022 2143   AST 20 02/06/2022 2143   ALT 17 02/06/2022 2143   ALKPHOS 48 02/06/2022 2143   BILITOT 1.4 (H) 02/06/2022 2143   GFRNONAA >60 02/06/2022 2143     Assessment: 1.  Abdominal pain: Generalized, worse during these "episodes", which are associated with a change in bowel habits toward looser stool and sometimes pale in appearance as well as nausea and occasional vomiting with decrease in appetite and some weight loss, eval in the ER with mostly normal labs and CT which was unrevealing; consider gallbladder etiology versus functional symptoms versus other 2.  Nausea/ vomiting: With above 3.  Change in bowel habits: With above 4.  Fatigue 5.  Anxiety 6.  Weight loss  Plan: 1.  Discussed with patient that it sounds like his symptoms may be all functional in nature or postviral/post-COVID IBS.  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 2.  Started  the patient on Pantoprazole 40 mg daily, 30-60 minutes before breakfast #30 with 5 refills. 3.  Ordered a right upper quadrant ultrasound to consider gallbladder etiology.  Could consider HIDA scan with CCK if this is negative. 4.  Patient will return to clinic in 4 to 6 weeks with me.  If really feeling no better or has had another episode then can consider EGD and colonoscopy for further evaluation. 5.  Did discuss anxiety/depression with the patient, it sounds like some of this may be going on with his generalized fatigue and mood change per his father.  He may want to discuss this further with his PCP. 6.  Ordered H. pylori fecal antigen.  Instructed patient not to start his Pantoprazole until after this test was submitted. 7.  Patient assigned to Dr. Candis Schatz this morning.  Ellouise Newer, PA-C [facility] 02/12/2022, 9:24 AM

## 2022-02-14 LAB — H. PYLORI ANTIGEN, STOOL: H pylori Ag, Stl: NEGATIVE

## 2022-02-15 NOTE — Progress Notes (Signed)
Agree with the assessment and plan as outlined by Jennifer Lemmon, PA-C. ? ?Vidhi Delellis E. Makyra Corprew, MD ? ?

## 2022-02-18 ENCOUNTER — Ambulatory Visit: Payer: Managed Care, Other (non HMO) | Admitting: Family Medicine

## 2022-02-21 ENCOUNTER — Ambulatory Visit (HOSPITAL_COMMUNITY)
Admission: RE | Admit: 2022-02-21 | Discharge: 2022-02-21 | Disposition: A | Discharge status: Home / Self Care | Payer: Commercial Managed Care - HMO | Source: Ambulatory Visit | Attending: Physician Assistant | Admitting: Physician Assistant

## 2022-02-21 DIAGNOSIS — R112 Nausea with vomiting, unspecified: Secondary | ICD-10-CM | POA: Diagnosis present

## 2022-02-21 DIAGNOSIS — R1013 Epigastric pain: Secondary | ICD-10-CM | POA: Diagnosis not present

## 2022-02-21 DIAGNOSIS — R5383 Other fatigue: Secondary | ICD-10-CM | POA: Diagnosis present

## 2022-02-21 DIAGNOSIS — R194 Change in bowel habit: Secondary | ICD-10-CM

## 2022-03-13 ENCOUNTER — Ambulatory Visit: Payer: Commercial Managed Care - HMO | Admitting: Gastroenterology

## 2022-03-28 ENCOUNTER — Ambulatory Visit: Payer: Commercial Managed Care - HMO | Admitting: Physician Assistant

## 2022-03-28 ENCOUNTER — Encounter: Payer: Self-pay | Admitting: Physician Assistant

## 2022-03-28 VITALS — BP 120/70 | HR 71 | Ht 65.0 in | Wt 135.0 lb

## 2022-03-28 DIAGNOSIS — R109 Unspecified abdominal pain: Secondary | ICD-10-CM

## 2022-03-28 DIAGNOSIS — K219 Gastro-esophageal reflux disease without esophagitis: Secondary | ICD-10-CM

## 2022-03-28 DIAGNOSIS — R112 Nausea with vomiting, unspecified: Secondary | ICD-10-CM | POA: Diagnosis not present

## 2022-03-28 NOTE — Progress Notes (Signed)
Chief Complaint: Follow up Epigastric Pain   HPI:    Mr. Nathan Beltran is a  27 y/o Caucasian male, assigned to Dr. Candis Schatz at last visit, with a past medical history of Hashimoto's disease, who returns to clinic today to follow-up his epigastric abdominal pain.    02/06/2022 patient seen in the ER for ongoing symptoms for months of fatigue, decreased appetite and frequent episodes of dizziness as well as heartburn and abdominal pain with nausea and some vomiting. CMP with a total bili 1.4 and glucose 141 otherwise normal. CBC with a white count of 10.7, urinalysis with trace protein and greater than 80 ketones, lipase and TSH normal. CTAP without contrast showed no acute intra-abdominal or intrapelvic abnormality.     02/12/2022 patient seen in clinic and at that time described 2 episodes that last for 13 to 15 days of epigastric/generalized abdominal pain, worsening fatigue, nausea, decreased appetite and occasional episodes of vomiting with reflux and diarrhea.  He had lost 9 pounds without really trying and seemed "depressed" per his dad.  At that time discussed that it sounds like his symptoms may be all functional in nature of course viral/post-COVID IBS.  Started the patient on Pantoprazole 40 mg daily and ordered right upper quadrant ultrasound to consider gallbladder etiology.  Discussed that if he is really feeling no better at follow-up then would consider EGD and colonoscopy for further eval.  Told him to discuss depression with his PCP and ordered H. pylori fecal antigen.    02/12/2022 H. pylori fecal antigen negative.    02/21/2022 right upper quadrant ultrasound with no cholelithiasis or sonographic evidence for acute cholecystitis.    Today, the patient presents to clinic companied by his mother.  He tells me that in general everything feels better.  His diarrhea is gone and his reflux has stopped.  In fact he stopped his Pantoprazole about 3 days ago because he does not have any further  symptoms.  Today the only ongoing pain is a "grabbing" sensation in his lower right side of his abdomen which can sometimes last for 30 to 60 minutes and hits and "randomly", this may or may not occur every day, it seems to be getting fewer and farther between though and tends to go away on its own.  He cannot tell any exacerbating or alleviating factors.      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    Denies fever, chills, weight loss or blood in his stool.  Past Medical History:  Diagnosis Date   Hashimoto's disease     Past Surgical History:  Procedure Laterality Date   HERNIA REPAIR     UMBILICAL HERNIA REPAIR      Current Outpatient Medications  Medication Sig Dispense Refill   pantoprazole (PROTONIX) 40 MG tablet Take 1 tablet (40 mg total) by mouth daily. 30 tablet 5   No current facility-administered medications for this visit.    Allergies as of 03/28/2022 - Review Complete 03/28/2022  Allergen Reaction Noted   Penicillins  09/07/2010    No family history on file.  Social History   Socioeconomic History   Marital status: Single    Spouse name: Not on file   Number of children: Not on file   Years of education: Not on file   Highest education level: Not on file  Occupational History   Not on file  Tobacco Use   Smoking status: Never   Smokeless tobacco: Never  Vaping Use   Vaping Use: Never used  Substance and Sexual Activity   Alcohol use: Never   Drug use: Never   Sexual activity: Not on file  Other Topics Concern   Not on file  Social History Narrative   Not on file   Social Determinants of Health   Financial Resource Strain: Not on file  Food Insecurity: Not on file  Transportation Needs: Not on file  Physical Activity: Not on file  Stress: Not on file  Social Connections: Not on file  Intimate Partner Violence: Not on file    Review of  Systems:    Constitutional: No weight loss, fever or chills Cardiovascular: No chest pain  Respiratory: No SOB  Gastrointestinal: See HPI and otherwise negative   Physical Exam:  Vital signs: BP 120/70   Pulse 71   Ht 5\' 5"  (1.651 m)   Wt 135 lb (61.2 kg)   BMI 22.47 kg/m    Constitutional:   Pleasant Caucasian male appears to be in NAD, Well developed, Well nourished, alert and cooperative Respiratory: Respirations even and unlabored. Lungs clear to auscultation bilaterally.   No wheezes, crackles, or rhonchi.  Cardiovascular: Normal S1, S2. No MRG. Regular rate and rhythm. No peripheral edema, cyanosis or pallor.  Gastrointestinal:  Soft, nondistended, nontender. No rebound or guarding. Normal bowel sounds. No appreciable masses or hepatomegaly. Rectal:  Not performed.  Psychiatric: Oriented to person, place and time. Demonstrates good judgement and reason without abnormal affect or behaviors.  RELEVANT LABS AND IMAGING: CBC    Component Value Date/Time   WBC 10.7 (H) 02/06/2022 2143   RBC 5.31 02/06/2022 2143   HGB 15.9 02/06/2022 2143   HCT 44.5 02/06/2022 2143   PLT 204 02/06/2022 2143   MCV 83.8 02/06/2022 2143   MCH 29.9 02/06/2022 2143   MCHC 35.7 02/06/2022 2143   RDW 12.0 02/06/2022 2143   LYMPHSABS 1.7 04/07/2021 1650   MONOABS 0.6 04/07/2021 1650   EOSABS 0.2 04/07/2021 1650   BASOSABS 0.0 04/07/2021 1650    CMP     Component Value Date/Time   NA 135 02/06/2022 2143   K 3.9 02/06/2022 2143   CL 100 02/06/2022 2143   CO2 22 02/06/2022 2143   GLUCOSE 141 (H) 02/06/2022 2143   BUN 19 02/06/2022 2143   CREATININE 1.08 02/06/2022 2143   CALCIUM 9.6 02/06/2022 2143   PROT 7.9 02/06/2022 2143   ALBUMIN 4.9 02/06/2022 2143   AST 20 02/06/2022 2143   ALT 17 02/06/2022 2143   ALKPHOS 48 02/06/2022 2143   BILITOT 1.4 (H) 02/06/2022 2143   GFRNONAA >60 02/06/2022 2143    Assessment: 1.  Abdominal pain: Epigastric pain is resolved, but has a right-sided  abdominal pain that comes and goes; consider musculoskeletal relation versus IBS versus gas pain 2.  GERD/nausea and vomiting: Resolved since last visit 3.  Change in bowel habits: Resolved since last visit  Plan: 1.  At this point it seems like all the patient's symptoms are improving.  Discussed that hopefully quitting his job has decreased his stress and anxiety which will help with his irritable bowel symptoms.  Hopefully they continue to improve.  If his pain lingers he can call us back, could consider treatment for gas with Gas-X versus treatment for SIBO versus antispasmodic 2.  No need for further gallbladder workup at this time with normal right upper quadrant ultrasound, if needed in  the future would need a HIDA scan 3.  Patient to follow in clinic with [place] as needed.  Ellouise Newer, PA-C [facility] 03/28/2022, 8:31 AM  Cc: Luetta Nutting, DO

## 2022-03-28 NOTE — Patient Instructions (Signed)
_______________________________________________________  If you are age 26 or older, your body mass index should be between 23-30. Your Body mass index is 22.47 kg/m. If this is out of the aforementioned range listed, please consider follow up with your Primary Care Provider.  If you are age 61 or younger, your body mass index should be between 19-25. Your Body mass index is 22.47 kg/m. If this is out of the aformentioned range listed, please consider follow up with your Primary Care Provider.   ________________________________________________________  The [facility] providers would like to encourage you to use [facility] to communicate with providers for non-urgent requests or questions.  Due to long hold times on the telephone, sending your provider a message by [facility] may be a faster and more efficient way to get a response.  Please allow 48 business hours for a response.  Please remember that this is for non-urgent requests.  _______________________________________________________  Follow up as needed.   Thank you for choosing me and [facility].  Ellouise Newer PA-C

## 2022-03-30 NOTE — Progress Notes (Signed)
Agree with the assessment and plan as outlined by Jennifer Lemmon, PA-C. ? ?Henok Heacock E. Annaleigh Steinmeyer, MD ? ?

## 2022-05-15 ENCOUNTER — Encounter: Payer: Self-pay | Admitting: Family Medicine

## 2022-05-15 ENCOUNTER — Ambulatory Visit (INDEPENDENT_AMBULATORY_CARE_PROVIDER_SITE_OTHER): Payer: Commercial Managed Care - HMO | Admitting: Family Medicine

## 2022-05-15 VITALS — BP 136/74 | HR 70 | Temp 97.5°F | Ht 64.5 in | Wt 135.4 lb

## 2022-05-15 DIAGNOSIS — F320 Major depressive disorder, single episode, mild: Secondary | ICD-10-CM

## 2022-05-15 DIAGNOSIS — R5382 Chronic fatigue, unspecified: Secondary | ICD-10-CM | POA: Diagnosis not present

## 2022-05-15 DIAGNOSIS — F419 Anxiety disorder, unspecified: Secondary | ICD-10-CM | POA: Diagnosis not present

## 2022-05-15 LAB — C-REACTIVE PROTEIN: CRP: <1 mg/dL (ref 0.5–20.0)

## 2022-05-15 LAB — SEDIMENTATION RATE: Sed Rate: 15 mm/hr (ref 0–15)

## 2022-05-15 MED ORDER — ESCITALOPRAM OXALATE 10 MG PO TABS: 5.0000 mg | ORAL_TABLET | Freq: Every day | ORAL | 0 refills | Status: DC

## 2022-05-15 NOTE — Progress Notes (Signed)
Assessment/Plan:   Problem List Items Addressed This Visit       Other   Chronic fatigue    Differential diagnosis: Sleep disorders (e.g., sleep apnea) Chronic fatigue syndrome Depression/anxiety   Plan: Conduct blood work for inflammatory markers such as sedimentation rate, C-reactive protein. Check ANA with reflex for possible autoimmune conditions. Order a sleep study to assess for sleep disorders. Referral to neurology for further evaluation of fatigue and associated symptoms. Consider supplementation with Vitamin D 1,25 dihydroxy as appropriate. Monitor for improvement or changes in symptoms.      Relevant Medications   escitalopram (LEXAPRO) 10 MG tablet   Other Relevant Orders   Sedimentation rate (Completed)   C-reactive protein (Completed)   ANA w/Reflex   Lyme Disease Serology w/Reflex   Cyclic citrul peptide antibody, IgG   Vitamin D 1,25 dihydroxy   Ambulatory referral to Sleep Studies   Iron, TIBC and Ferritin Panel   Anxiety   Relevant Medications   escitalopram (LEXAPRO) 10 MG tablet   Other Relevant Orders   Sedimentation rate (Completed)   C-reactive protein (Completed)   ANA w/Reflex   Lyme Disease Serology w/Reflex   Cyclic citrul peptide antibody, IgG   Vitamin D 1,25 dihydroxy   Ambulatory referral to Sleep Studies   Current mild episode of major depressive disorder ([place]) - Primary    Differential diagnosis: Generalized anxiety disorder Major depressive disorder Plan:  Initiate escitalopram (LEXAPRO) 5 MG tablet with a planned reassessment in one month. Consider offering mental health support resources and potential referral for therapy. Monitor for any signs of increased depression or suicidal ideation. If presenting, prompt mental health intervention is critical.      Relevant Medications   escitalopram (LEXAPRO) 10 MG tablet   Other Relevant Orders   Sedimentation rate (Completed)   C-reactive protein (Completed)   ANA w/Reflex    Lyme Disease Serology w/Reflex   Cyclic citrul peptide antibody, IgG   Vitamin D 1,25 dihydroxy   Ambulatory referral to Sleep Studies    There are no discontinued medications.    Subjective:  HPI: Encounter date: 05/15/2022  Nathan Beltran is a 27 y.o. male who has Short stature; Thyroiditis; Chronic fatigue; Anxiety; and Current mild episode of major depressive disorder ([place]) on their problem list..   He  has a past medical history of Hashimoto's disease.Marland Kitchen   CHIEF COMPLAINT: The patient, a 27 year old male, presents for establishment of care with complaints of fatigue, brain fog, eye soreness, anxiety, and dizziness persisting for approximately 1.5 years.  HISTORY OF PRESENT ILLNESS:  Fatigue/Generalized Weakness: The patient reports ongoing fatigue and a sensation akin to flu-like symptoms. He notes a decline in his usual strength, as evidenced by difficulty lifting objects that were previously manageable. His symptoms appear to worsen with exertion and improve with rest. Efforts to address these issues with pantoprazole for presumed GERD have been unsuccessful, as the patient found no relief and discontinued the medication. A referral has been made to gastroenterology.  Anxiety/Dizziness: The patient additionally suffers from anxiety which exacerbates with activities such as driving, leading to post-activity fatigue. Episodes of dizziness, particularly at night, have been reported, with sensations of spinning that last several hours.  ROS:  Neurologic: Positive for dizziness and brain fog. ENT: Positive for eye soreness. GI: Negative for upper right quadrant pain after previous evaluation for cholelithiasis and acute cholecystitis. Psych: Endorses feelings of being better off dead; denies suicidal intentions. Remainder of ROS negative.     05/15/2022  10:36 AM  GAD 7 : Generalized Anxiety Score  Nervous, Anxious, on Edge 1  Control/stop worrying 1  Worry too much - different  things 1  Trouble relaxing 1  Restless 1  Easily annoyed or irritable 1  Afraid - awful might happen 1  Total GAD 7 Score 7  Anxiety Difficulty Somewhat difficult    [place]: 3     05/15/2022   10:36 AM  Depression screen PHQ 2/9  Decreased Interest 1  Down, Depressed, Hopeless 1  PHQ - 2 Score 2  Altered sleeping 1  Tired, decreased energy 2  Change in appetite 1  Feeling bad or failure about yourself  1  Trouble concentrating 2  Moving slowly or fidgety/restless 1  Suicidal thoughts 1  PHQ-9 Score 11  Difficult doing work/chores Somewhat difficult     Past Surgical History:  Procedure Laterality Date   INGUINAL HERNIA REPAIR Bilateral    age 40   UMBILICAL HERNIA REPAIR     age 27    Outpatient Medications Prior to Visit  Medication Sig Dispense Refill   pantoprazole (PROTONIX) 40 MG tablet Take 1 tablet (40 mg total) by mouth daily. (Patient not taking: Reported on 05/15/2022) 30 tablet 5   No facility-administered medications prior to visit.    Family History  Problem Relation Age of Onset   Asthma Mother    Hypothyroidism Father    Other Father        Alpha gal   Breast cancer Maternal Grandmother    Lung cancer Maternal Grandfather        smoker   Ovarian cancer Maternal Aunt    Colon cancer Neg Hx    Rectal cancer Neg Hx    Esophageal cancer Neg Hx     Social History   Socioeconomic History   Marital status: Single    Spouse name: Not on file   Number of children: 0   Years of education: Not on file   Highest education level: Not on file  Occupational History   Not on file  Tobacco Use   Smoking status: Never    Passive exposure: Never   Smokeless tobacco: Never  Vaping Use   Vaping Use: Never used  Substance and Sexual Activity   Alcohol use: Never   Drug use: Never   Sexual activity: Not on file  Other Topics Concern   Not on file  Social History Narrative   Not on file   Social Determinants of Health   Financial Resource  Strain: Not on file  Food Insecurity: Not on file  Transportation Needs: Not on file  Physical Activity: Not on file  Stress: Not on file  Social Connections: Not on file  Intimate Partner Violence: Not on file                                                                                                 Objective:  Physical Exam: BP 136/74 (BP Location: Left Arm, Patient Position: Sitting, Cuff Size: Large)   Pulse 70   Temp (!) 97.5 F (36.4 C) (Temporal)  Ht 5' 4.5" (1.638 m)   Wt 135 lb 6.4 oz (61.4 kg)   SpO2 100%   BMI 22.88 kg/m    General: No acute distress. Awake and conversant.  Eyes: Normal conjunctiva, anicteric. Round symmetric pupils.  ENT: Hearing grossly intact. No nasal discharge.  Neck: Neck is supple. No masses or thyromegaly.  Respiratory: Respirations are non-labored. No auditory wheezing.  Skin: Warm. No rashes or ulcers.  Psych: Alert and oriented. Cooperative, Appropriate mood and affect, Normal judgment.  CV: No cyanosis or JVD MSK: Normal ambulation. No clubbing  Neuro: Sensation and CN II-XII grossly normal.        Alesia Banda, MD, MS

## 2022-05-15 NOTE — Assessment & Plan Note (Signed)
Differential diagnosis: Sleep disorders (e.g., sleep apnea) Chronic fatigue syndrome Depression/anxiety   Plan: Conduct blood work for inflammatory markers such as sedimentation rate, C-reactive protein. Check ANA with reflex for possible autoimmune conditions. Order a sleep study to assess for sleep disorders. Referral to neurology for further evaluation of fatigue and associated symptoms. Consider supplementation with Vitamin D 1,25 dihydroxy as appropriate. Monitor for improvement or changes in symptoms.

## 2022-05-15 NOTE — Patient Instructions (Signed)
For fatigue we are getting blood work as discussed.  We are also referring to sleep medicine.  For anxiety and depression, restarting escitalopram.  Please take as prescribed  For urgent psychiatric assistance you can go to   [facility] Phone: [phone]  [address] [place], [place] [address]  Hours: Open 24/7, No appointment required.  [facility] [place], [place] [address] Phone: [phone]  When you're going through tough times, it's easy to feel lonely and overwhelmed. Remember that YOU ARE NOT ALONE and we at [facility] want to support you during this difficult time.  There are many ways to get help and support when you are ready.  You can call the following help lines: - National suicide hotline 972 733 7111) - [employer] (1-800-SUICIDE)   You can schedule an appointment with a team member with your primary care doc or one of our counselors.  We are all here to support you.  A doctor is on call 24/7   There are many treatments that can help people during difficult times, and we can talk with you about medications, counseling, diet, and other options. We want to offer you HOPE that it won't always feel this bad.   If you are seriously thinking about hurting yourself or have a plan, please call 911 or go to any emergency room right away for immediate help.

## 2022-05-15 NOTE — Assessment & Plan Note (Signed)
Differential diagnosis: Generalized anxiety disorder Major depressive disorder Plan:  Initiate escitalopram (LEXAPRO) 5 MG tablet with a planned reassessment in one month. Consider offering mental health support resources and potential referral for therapy. Monitor for any signs of increased depression or suicidal ideation. If presenting, prompt mental health intervention is critical.

## 2022-05-16 LAB — ANA W/REFLEX: Anti Nuclear Antibody (ANA): NEGATIVE

## 2022-05-16 LAB — LYME DISEASE SEROLOGY W/REFLEX: Lyme Total Antibody EIA: NEGATIVE

## 2022-05-19 LAB — IRON,TIBC AND FERRITIN PANEL
%SAT: 32 % (calc) (ref 20–48)
Ferritin: 138 ng/mL (ref 38–380)
Iron: 99 ug/dL (ref 50–195)
TIBC: 312 mcg/dL (calc) (ref 250–425)

## 2022-05-19 LAB — VITAMIN D 1,25 DIHYDROXY
Vitamin D 1, 25 (OH)2 Total: 32 pg/mL (ref 18–72)
Vitamin D2 1, 25 (OH)2: <8 pg/mL
Vitamin D3 1, 25 (OH)2: 32 pg/mL

## 2022-05-19 LAB — CYCLIC CITRUL PEPTIDE ANTIBODY, IGG: Cyclic Citrullin Peptide Ab: <16 UNITS

## 2022-06-12 ENCOUNTER — Encounter: Payer: Self-pay | Admitting: Family Medicine

## 2022-06-12 ENCOUNTER — Ambulatory Visit: Payer: Commercial Managed Care - HMO | Admitting: Family Medicine

## 2022-06-12 VITALS — BP 122/74 | HR 87 | Temp 97.5°F | Wt 135.0 lb

## 2022-06-12 DIAGNOSIS — F419 Anxiety disorder, unspecified: Secondary | ICD-10-CM | POA: Diagnosis not present

## 2022-06-12 DIAGNOSIS — R198 Other specified symptoms and signs involving the digestive system and abdomen: Secondary | ICD-10-CM | POA: Insufficient documentation

## 2022-06-12 DIAGNOSIS — F320 Major depressive disorder, single episode, mild: Secondary | ICD-10-CM

## 2022-06-12 DIAGNOSIS — R5382 Chronic fatigue, unspecified: Secondary | ICD-10-CM

## 2022-06-12 DIAGNOSIS — R42 Dizziness and giddiness: Secondary | ICD-10-CM

## 2022-06-12 DIAGNOSIS — T781XXA Other adverse food reactions, not elsewhere classified, initial encounter: Secondary | ICD-10-CM

## 2022-06-12 DIAGNOSIS — R109 Unspecified abdominal pain: Secondary | ICD-10-CM

## 2022-06-12 DIAGNOSIS — W57XXXS Bitten or stung by nonvenomous insect and other nonvenomous arthropods, sequela: Secondary | ICD-10-CM

## 2022-06-12 DIAGNOSIS — L299 Pruritus, unspecified: Secondary | ICD-10-CM

## 2022-06-12 DIAGNOSIS — W57XXXA Bitten or stung by nonvenomous insect and other nonvenomous arthropods, initial encounter: Secondary | ICD-10-CM

## 2022-06-12 DIAGNOSIS — G8929 Other chronic pain: Secondary | ICD-10-CM

## 2022-06-12 MED ORDER — DICYCLOMINE HCL 10 MG PO CAPS: 10.0000 mg | ORAL_CAPSULE | Freq: Three times a day (TID) | ORAL | 2 refills | Status: DC

## 2022-06-12 MED ORDER — ESCITALOPRAM OXALATE 10 MG PO TABS: 10.0000 mg | ORAL_TABLET | Freq: Every day | ORAL | 0 refills | Status: DC

## 2022-06-12 MED ORDER — MECLIZINE HCL 12.5 MG PO TABS: 12.5000 mg | ORAL_TABLET | Freq: Three times a day (TID) | ORAL | 0 refills | Status: DC | PRN

## 2022-06-12 NOTE — Assessment & Plan Note (Signed)
MDD and Anxiety with Fatigue  Plan: Increase escitalopram 10 mg,  Monitor mood, energy, and anxiety levels closely.

## 2022-06-12 NOTE — Patient Instructions (Signed)
For anxiety depression, we are increasing escitalopram to 10 mg.  For abdominal pain with diarrhea constipation, we are getting labs as discussed and referring to gastroenterology.  You may trial Bentyl as prescribed.  You may also trial a low FODMAP diet as well as a low gluten diet while we are assessing for celiac disease versus IBS.  For ear itching and dizziness, we are referring to ENT as requested.  You may also trial meclizine for dizziness.  Trial of the vestibular exercises for possible vertigo.

## 2022-06-12 NOTE — Assessment & Plan Note (Signed)
Differential Diagnosis: Celiac disease, IBS, gluten sensitivity, SIBO.  Plan: Patient follow back up with gastroenterology gastroenterology for further evaluation and possible endoscopy. Order labs studies including Celiac Disease Ab Screen w/Rfx, Stool Culture, PCR, and inflammatory markers. Advise trial of a low FODMAP and low gluten diet pending gastroenterological assessment.

## 2022-06-12 NOTE — Progress Notes (Signed)
Assessment/Plan:   Problem List Items Addressed This Visit       Musculoskeletal and Integument   RESOLVED: Tick bite     Other   Chronic fatigue   Relevant Medications   escitalopram (LEXAPRO) 10 MG tablet   Anxiety   Relevant Medications   escitalopram (LEXAPRO) 10 MG tablet   Current mild episode of major depressive disorder (HCC) - Primary    MDD and Anxiety with Fatigue  Plan: Increase escitalopram 10 mg,  Monitor mood, energy, and anxiety levels closely.      Relevant Medications   escitalopram (LEXAPRO) 10 MG tablet   Chronic abdominal pain    Differential Diagnosis: Celiac disease, IBS, gluten sensitivity, SIBO.  Plan: Patient follow back up with gastroenterology gastroenterology for further evaluation and possible endoscopy. Order labs studies including Celiac Disease Ab Screen w/Rfx, Stool Culture, PCR, and inflammatory markers. Advise trial of a low FODMAP and low gluten diet pending gastroenterological assessment.      Relevant Medications   escitalopram (LEXAPRO) 10 MG tablet   Alternating constipation and diarrhea   Relevant Medications   dicyclomine (BENTYL) 10 MG capsule   Other Relevant Orders   CALPROTECTIN   Celiac Disease Ab Screen w/Rfx   Alpha-Gal Panel   Ambulatory referral to Gastroenterology   Fecal occult blood, imunochemical   GI Profile, Stool, PCR   Stool Culture   Stool, WBC/Lactoferrin   Fecal fat, qualitative   Osmolality, stool   Dizziness    Differential Diagnosis: Eustachian tube dysfunction, vestibular disorders.  Plan: Referral to ENT for comprehensive evaluation. Trial of meclizine for dizziness, and observe for improvement.      Relevant Medications   meclizine (ANTIVERT) 12.5 MG tablet   Other Relevant Orders   Ambulatory referral to ENT   Itching of ear   Relevant Orders   Ambulatory referral to ENT   Medications Discontinued During This Encounter  Medication Reason   pantoprazole (PROTONIX) 40 MG tablet     escitalopram (LEXAPRO) 10 MG tablet Reorder    Return in about 2 months (around 08/12/2022) for Fatigue, depression, abdominal pain.    Subjective:   Encounter date: 06/12/2022  Nathan Beltran is a 27 y.o. male who has Short stature; Thyroiditis; Chronic fatigue; Anxiety; Current mild episode of major depressive disorder ([place]); Chronic abdominal pain; Alternating constipation and diarrhea; Dizziness; and Itching of ear on their problem list..   He  has a past medical history of Hashimoto's disease and Tick bite (06/12/2022)..   Chief Complaint: Fatigue (Follow up on fatigue), Anxiety depression, and gastrointestinal symptoms (abdominal pain, constipation, diarrhea).  History of Present Illness:  Fatigue/Depression: Patient has been experiencing chronic fatigue and has a history of anxiety and mild episode of major depressive disorder. The patient reports an improvement in mental symptoms after initiating escitalopram 5 mg daily.  However he reports that he still tired.  Family states that they have not heard back from the sleep study however there was a message sent MyChart.  Patient counseled encouraged to follow-up with sleep medicine to explore potential sleep disturbances as related to fatigue.    Gastrointestinal Symptoms: The patient presents with ongoing abdominal pain, alternating constipation, and diarrhea since approximately August of 2023, with symptoms exacerbating after consuming gluten-rich foods. Patient questions gluten sensitivity/celiac disease. Moreover, there is concern about changes in stool caliber, intermittent bowel habits, and weight change. Previously conducted imaging and blood work have not identified a definitive cause. The patient is also experiencing itching deep within  the ear and intermittent dizziness.  Review of Systems  Constitutional:  Negative for chills, diaphoresis, fever, malaise/fatigue and weight loss.  HENT:  Negative for congestion, ear discharge,  ear pain (itching in left) and hearing loss.   Eyes:  Positive for pain. Negative for blurred vision, double vision, photophobia, discharge and redness.  Respiratory:  Negative for cough, sputum production, shortness of breath and wheezing.   Cardiovascular:  Negative for chest pain and palpitations.  Gastrointestinal:  Positive for abdominal pain, constipation and diarrhea. Negative for blood in stool, heartburn, melena, nausea and vomiting.  Genitourinary:  Negative for dysuria, flank pain, frequency, hematuria and urgency.  Musculoskeletal:  Negative for myalgias.  Skin:  Negative for itching and rash.  Neurological:  Positive for dizziness and tingling. Negative for tremors, speech change, seizures, loss of consciousness, weakness and headaches.  Psychiatric/Behavioral:  Positive for depression. Negative for hallucinations, memory loss, substance abuse and suicidal ideas. The patient is nervous/anxious. The patient does not have insomnia.     Past Surgical History:  Procedure Laterality Date   INGUINAL HERNIA REPAIR Bilateral    age 78   UMBILICAL HERNIA REPAIR     age 35    Outpatient Medications Prior to Visit  Medication Sig Dispense Refill   escitalopram (LEXAPRO) 10 MG tablet Take 0.5 tablets (5 mg total) by mouth daily. 15 tablet 0   pantoprazole (PROTONIX) 40 MG tablet Take 1 tablet (40 mg total) by mouth daily. (Patient not taking: Reported on 05/15/2022) 30 tablet 5   No facility-administered medications prior to visit.    Family History  Problem Relation Age of Onset   Asthma Mother    Hypothyroidism Father    Other Father        Alpha gal   Breast cancer Maternal Grandmother    Lung cancer Maternal Grandfather        smoker   Ovarian cancer Maternal Aunt    Colon cancer Neg Hx    Rectal cancer Neg Hx    Esophageal cancer Neg Hx     Social History   Socioeconomic History   Marital status: Single    Spouse name: Not on file   Number of children: 0   Years of  education: Not on file   Highest education level: Not on file  Occupational History   Not on file  Tobacco Use   Smoking status: Never    Passive exposure: Never   Smokeless tobacco: Never  Vaping Use   Vaping Use: Never used  Substance and Sexual Activity   Alcohol use: Never   Drug use: Never   Sexual activity: Not on file  Other Topics Concern   Not on file  Social History Narrative   Not on file   Social Determinants of Health   Financial Resource Strain: Not on file  Food Insecurity: Not on file  Transportation Needs: Not on file  Physical Activity: Not on file  Stress: Not on file  Social Connections: Not on file  Intimate Partner Violence: Not on file  Objective:  Physical Exam: BP 122/74 (BP Location: Left Arm, Patient Position: Sitting, Cuff Size: Large)   Pulse 87   Temp (!) 97.5 F (36.4 C) (Temporal)   Wt 135 lb (61.2 kg)   SpO2 99%   BMI 22.81 kg/m   Wt Readings from Last 3 Encounters:  06/12/22 135 lb (61.2 kg)  05/15/22 135 lb 6.4 oz (61.4 kg)  03/28/22 135 lb (61.2 kg)     Physical Exam Constitutional:      Appearance: Normal appearance.  HENT:     Head: Normocephalic and atraumatic.     Right Ear: Hearing, tympanic membrane, ear canal and external ear normal. There is no impacted cerumen.     Left Ear: Hearing, tympanic membrane, ear canal and external ear normal. There is no impacted cerumen.     Nose: Nose normal.  Eyes:     General: No scleral icterus.       Right eye: No discharge.        Left eye: No discharge.     Extraocular Movements: Extraocular movements intact.     Conjunctiva/sclera: Conjunctivae normal.     Pupils: Pupils are equal, round, and reactive to light.  Cardiovascular:     Rate and Rhythm: Normal rate and regular rhythm.     Heart sounds: Normal heart sounds.  Pulmonary:     Effort: Pulmonary effort is normal.      Breath sounds: Normal breath sounds.  Abdominal:     Palpations: Abdomen is soft.     Tenderness: There is no abdominal tenderness.  Skin:    General: Skin is warm.     Findings: No rash.  Neurological:     General: No focal deficit present.     Mental Status: He is alert.     Cranial Nerves: No cranial nerve deficit.  Psychiatric:        Mood and Affect: Mood normal.        Behavior: Behavior normal.        Thought Content: Thought content normal.        Judgment: Judgment normal.     No results found.  Recent Results (from the past 2160 hour(s))  Sedimentation rate     Status: None   Collection Time: 05/15/22 10:30 AM  Result Value Ref Range   Sed Rate 15 0 - 15 mm/hr  C-reactive protein     Status: None   Collection Time: 05/15/22 10:30 AM  Result Value Ref Range   CRP <1.0 0.5 - 20.0 mg/dL  ANA w/Reflex     Status: None   Collection Time: 05/15/22 10:30 AM  Result Value Ref Range   Anti Nuclear Antibody (ANA) Negative Negative  Lyme Disease Serology w/Reflex     Status: None   Collection Time: 05/15/22 10:30 AM  Result Value Ref Range   Lyme Total Antibody EIA Negative Negative    Comment: Lyme antibodies not detected. Reflex testing is not indicated. No laboratory evidence of infection with B. burgdorferi (Lyme disease). Negative results may occur in patients recently infected (less than or equal to 14 days) with B. burgdorferi.  If recent infection is suspected, repeat testing on a new sample collected in 7 to 14 days is recommended.   Cyclic citrul peptide antibody, IgG     Status: None   Collection Time: 05/15/22 10:30 AM  Result Value Ref Range   Cyclic Citrullin Peptide Ab <16 UNITS    Comment: Reference Range Negative:            <  20 Weak Positive:       20-39 Moderate Positive:   40-59 Strong Positive:     >59 .   Vitamin D 1,25 dihydroxy     Status: None   Collection Time: 05/15/22 10:30 AM  Result Value Ref Range   Vitamin D 1, 25 (OH)2 Total  32 18 - 72 pg/mL   Vitamin D3 1, 25 (OH)2 32 pg/mL   Vitamin D2 1, 25 (OH)2 <8 pg/mL    Comment: (Note) Vitamin D3, 1,25(OH)2 indicates both endogenous production and  supplementation. Vitamin D2, 1,25(OH)2 is an indicator of exogenous  sources, such as diet or supplementation. Interpretation and therapy  are based on measurement of Vitamin D, 1,25 (OH)2, Total. . This test was developed, and its analytical performance  characteristics have been determined by [employer]. It has not  been cleared or approved by the FDA. This assay has been validated  pursuant to the CLIA regulations and is used for clinical purposes. . For additional information, please refer to http://education.[facility].com/faq/FAQ199 (This link is being provided for informational/educational purposes  only.) . [facility] [address] [place] [address] [phone] Robert L. Breckenridge, MD   Iron, TIBC and Ferritin Panel     Status: None   Collection Time: 05/15/22 10:30 AM  Result Value Ref Range   Iron 99 50 - 195 mcg/dL   TIBC 312 250 - 425 mcg/dL (calc)   %SAT 32 20 - 48 % (calc)   Ferritin 138 38 - 380 ng/mL        Alesia Banda, MD, MS

## 2022-06-12 NOTE — Assessment & Plan Note (Signed)
Differential Diagnosis: Eustachian tube dysfunction, vestibular disorders.  Plan: Referral to ENT for comprehensive evaluation. Trial of meclizine for dizziness, and observe for improvement.

## 2022-06-13 ENCOUNTER — Other Ambulatory Visit (INDEPENDENT_AMBULATORY_CARE_PROVIDER_SITE_OTHER): Payer: Commercial Managed Care - HMO

## 2022-06-13 DIAGNOSIS — R198 Other specified symptoms and signs involving the digestive system and abdomen: Secondary | ICD-10-CM | POA: Diagnosis not present

## 2022-06-13 NOTE — Progress Notes (Signed)
Pt here to drop off stool samples.

## 2022-06-14 LAB — CELIAC DISEASE AB SCREEN W/RFX
Antigliadin Abs, IgA: 5 units (ref 0–19)
IgA/Immunoglobulin A, Serum: 169 mg/dL (ref 90–386)
Transglutaminase IgA: <2 U/mL (ref 0–3)

## 2022-06-14 LAB — FECAL OCCULT BLOOD, IMMUNOCHEMICAL: Fecal Occult Bld: NEGATIVE

## 2022-06-15 LAB — ALPHA-GAL PANEL
Allergen, Mutton, f88: <0.1 kU/L
Allergen, Pork, f26: 0.15 kU/L — ABNORMAL HIGH
Beef: 0.15 kU/L — ABNORMAL HIGH
Class: 0
GALACTOSE-ALPHA-1,3-GALACTOSE IGE*: 0.76 kU/L — ABNORMAL HIGH (ref <0.10)

## 2022-06-15 LAB — INTERPRETATION:

## 2022-06-17 LAB — STOOL CULTURE: E coli, Shiga toxin Assay: NEGATIVE

## 2022-06-19 LAB — GI PROFILE, STOOL, PCR

## 2022-06-19 LAB — FECAL FAT, QUALITATIVE
Fat Qual Neutral, Stl: NORMAL
Fat Qual Total, Stl: NORMAL

## 2022-06-19 LAB — FECAL LACTOFERRIN, QUANT
Fecal Lactoferrin: NEGATIVE
MICRO NUMBER:: 14723877
SPECIMEN QUALITY:: ADEQUATE

## 2022-06-19 LAB — CALPROTECTIN: Calprotectin: 22 mcg/g

## 2022-06-19 LAB — OSMOLALITY, STOOL

## 2022-06-27 ENCOUNTER — Ambulatory Visit: Payer: Commercial Managed Care - HMO | Admitting: Nurse Practitioner

## 2022-07-11 NOTE — Addendum Note (Signed)
Addended by: Garnette Gunner on: 07/11/2022 08:18 AM   Modules accepted: Orders

## 2022-08-21 ENCOUNTER — Ambulatory Visit: Payer: Commercial Managed Care - HMO | Admitting: Family Medicine

## 2022-08-21 ENCOUNTER — Encounter: Payer: Self-pay | Admitting: Family Medicine

## 2022-08-21 VITALS — BP 114/68 | HR 94 | Temp 97.8°F | Wt 134.6 lb

## 2022-08-21 DIAGNOSIS — K529 Noninfective gastroenteritis and colitis, unspecified: Secondary | ICD-10-CM | POA: Insufficient documentation

## 2022-08-21 DIAGNOSIS — F320 Major depressive disorder, single episode, mild: Secondary | ICD-10-CM | POA: Diagnosis not present

## 2022-08-21 DIAGNOSIS — T781XXA Other adverse food reactions, not elsewhere classified, initial encounter: Secondary | ICD-10-CM | POA: Insufficient documentation

## 2022-08-21 DIAGNOSIS — R5382 Chronic fatigue, unspecified: Secondary | ICD-10-CM | POA: Diagnosis not present

## 2022-08-21 LAB — CBC WITH DIFFERENTIAL/PLATELET
Basophils Absolute: 0 10*3/uL (ref 0.0–0.1)
Basophils Relative: 0.8 % (ref 0.0–3.0)
Eosinophils Absolute: 0.5 10*3/uL (ref 0.0–0.7)
Eosinophils Relative: 10.3 % — ABNORMAL HIGH (ref 0.0–5.0)
HCT: 44.1 % (ref 39.0–52.0)
Hemoglobin: 15 g/dL (ref 13.0–17.0)
Lymphocytes Relative: 27 % (ref 12.0–46.0)
Lymphs Abs: 1.4 10*3/uL (ref 0.7–4.0)
MCHC: 34.1 g/dL (ref 30.0–36.0)
MCV: 87.6 fl (ref 78.0–100.0)
Monocytes Absolute: 0.6 10*3/uL (ref 0.1–1.0)
Monocytes Relative: 11.5 % (ref 3.0–12.0)
Neutro Abs: 2.7 10*3/uL (ref 1.4–7.7)
Neutrophils Relative %: 50.4 % (ref 43.0–77.0)
Platelets: 187 10*3/uL (ref 150.0–400.0)
RBC: 5.03 Mil/uL (ref 4.22–5.81)
RDW: 13 % (ref 11.5–15.5)
WBC: 5.3 10*3/uL (ref 4.0–10.5)

## 2022-08-21 LAB — BASIC METABOLIC PANEL
BUN: 16 mg/dL (ref 6–23)
CO2: 30 mEq/L (ref 19–32)
Calcium: 9.4 mg/dL (ref 8.4–10.5)
Chloride: 104 mEq/L (ref 96–112)
Creatinine, Ser: 1.15 mg/dL (ref 0.40–1.50)
GFR: 88.18 mL/min (ref >60.00)
Glucose, Bld: 93 mg/dL (ref 70–99)
Potassium: 3.6 mEq/L (ref 3.5–5.1)
Sodium: 141 mEq/L (ref 135–145)

## 2022-08-21 MED ORDER — MULTI-VITAMIN/MINERALS PO TABS: 1.0000 | ORAL_TABLET | Freq: Every day | ORAL | 3 refills | Status: AC

## 2022-08-21 MED ORDER — LOPERAMIDE HCL 2 MG PO CAPS: 4.0000 mg | ORAL_CAPSULE | Freq: Four times a day (QID) | ORAL | 0 refills | Status: AC | PRN

## 2022-08-21 MED ORDER — EPINEPHRINE 0.3 MG/0.3ML IJ SOAJ
0.3000 mg | INTRAMUSCULAR | 1 refills | Status: AC | PRN
Start: 2022-08-21 — End: ?

## 2022-08-21 NOTE — Progress Notes (Signed)
Assessment/Plan:   Problem List Items Addressed This Visit       Digestive   Chronic diarrhea    Continue adherence to the alpha-gal diet. Start loperamide (Imodium) as needed to manage chronic diarrhea. Suggest increasing fluid and electrolyte intake. Check CBC, electrolytes to evaluate for anemia and electrolyte imbalance. Follow-up with [facility]       Relevant Medications   loperamide (IMODIUM) 2 MG capsule   Multiple Vitamins-Minerals (MULTIVITAMIN WITH MINERALS) tablet   Other Relevant Orders   Basic Metabolic Panel (BMET)   CBC w/Diff     Other   Chronic fatigue - Primary    Plan:  Monitor symptoms and hydration status. Ensure adequate nutrition, consider multivitamin or meal supplements. Continue monitoring blood pressure.      Current mild episode of major depressive disorder (HCC)    Improvement noted; no need for current intervention. Discussed possible causes related to alpha-gal syndrome and chronic symptoms.      Allergic reaction to alpha-gal   Relevant Medications   EPINEPHrine (EPIPEN 2-PAK) 0.3 mg/0.3 mL IJ SOAJ injection    Medications Discontinued During This Encounter  Medication Reason   dicyclomine (BENTYL) 10 MG capsule    meclizine (ANTIVERT) 12.5 MG tablet    escitalopram (LEXAPRO) 10 MG tablet     Return if symptoms worsen or fail to improve, for diarhea and dehydration.    Subjective:   Encounter date: 08/21/2022  Nathan Beltran is a 27 y.o. 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   He  has a past medical history of Hashimoto's disease and Tick bite (06/12/2022)..  Subjective: Chief Complaint: The patient presents with persistent symptoms despite following an alpha-gal  diet.  History of Present Illness:  Nathan Beltran is experiencing ongoing fatigue, diarrhea, and brain fog despite adhering to an alpha-gal diet, which has only alleviated about 50% of his symptoms. Notably, symptoms like ear itching and abdominal side pain have resolved. He reports chronic diarrhea and believes these symptoms may be related to alpha-gal exposure.  @    08/21/2022    8:57 AM 06/12/2022   10:26 AM 05/15/2022   10:36 AM  Depression screen PHQ 2/9  Decreased Interest 0 1 1  Down, Depressed, Hopeless 0 0 1  PHQ - 2 Score 0 1 2  Altered sleeping 0 1 1  Tired, decreased energy 1 2 2   Change in appetite 0 0 1  Feeling bad or failure about yourself  0 0 1  Trouble concentrating 1 1 2   Moving slowly or fidgety/restless 1 0 1  Suicidal thoughts 0 0 1  PHQ-9 Score 3 5 11   Difficult doing work/chores Not difficult at all Somewhat difficult Somewhat difficult       08/21/2022    8:57 AM  GAD 7 : Generalized Anxiety Score  Nervous, Anxious, on Edge 1  Control/stop worrying 1  Worry too much - different things 1  Trouble relaxing 0  Restless 0  Easily annoyed or irritable 0  Afraid - awful might happen 0  Total GAD 7 Score 3  Anxiety Difficulty Not difficult at all    ROS: No SI or HI.   Review of Systems  Constitutional:  Negative for chills, diaphoresis, fever, malaise/fatigue and weight loss.  HENT:  Negative for congestion, ear discharge, ear  pain (itching in left) and hearing loss.   Eyes:  Negative for blurred vision, double vision, photophobia, pain, discharge and redness.  Respiratory:  Negative for cough, sputum production, shortness of breath and wheezing.   Cardiovascular:  Negative for chest pain and palpitations.       Reports hypotension at home with blood pressure 80/60 after recent episode of diarrhea  Gastrointestinal:  Positive for diarrhea. Negative for abdominal pain, blood in stool, constipation, heartburn, melena, nausea and vomiting.  Genitourinary:   Negative for dysuria, flank pain, frequency, hematuria and urgency.  Musculoskeletal:  Negative for myalgias.  Skin:  Negative for itching and rash.  Neurological:  Negative for dizziness, tingling, tremors, speech change, seizures, loss of consciousness, weakness and headaches.       "Brain Fog"  Psychiatric/Behavioral:  Negative for depression, hallucinations, memory loss, substance abuse and suicidal ideas. The patient is not nervous/anxious and does not have insomnia.     Past Surgical History:  Procedure Laterality Date   INGUINAL HERNIA REPAIR Bilateral    age 60   UMBILICAL HERNIA REPAIR     age 28    Outpatient Medications Prior to Visit  Medication Sig Dispense Refill   dicyclomine (BENTYL) 10 MG capsule Take 1 capsule (10 mg total) by mouth 4 (four) times daily -  before meals and at bedtime. (Patient not taking: Reported on 08/21/2022) 120 capsule 2   escitalopram (LEXAPRO) 10 MG tablet Take 1 tablet (10 mg total) by mouth daily. 30 tablet 0   meclizine (ANTIVERT) 12.5 MG tablet Take 1 tablet (12.5 mg total) by mouth 3 (three) times daily as needed for dizziness. (Patient not taking: Reported on 08/21/2022) 30 tablet 0   No facility-administered medications prior to visit.    Family History  Problem Relation Age of Onset   Asthma Mother    Hypothyroidism Father    Other Father        Alpha gal   Breast cancer Maternal Grandmother    Lung cancer Maternal Grandfather        smoker   Ovarian cancer Maternal Aunt    Colon cancer Neg Hx    Rectal cancer Neg Hx    Esophageal cancer Neg Hx     Social History   Socioeconomic History   Marital status: Single    Spouse name: Not on file   Number of children: 0   Years of education: Not on file   Highest education level: Not on file  Occupational History   Not on file  Tobacco Use   Smoking status: Never    Passive exposure: Never   Smokeless tobacco: Never  Vaping Use   Vaping Use: Never used  Substance and Sexual  Activity   Alcohol use: Never   Drug use: Never   Sexual activity: Not on file  Other Topics Concern   Not on file  Social History Narrative   Not on file   Social Determinants of Health   Financial Resource Strain: Not on file  Food Insecurity: Not on file  Transportation Needs: Not on file  Physical Activity: Not on file  Stress: Not on file  Social Connections: Not on file  Intimate Partner Violence: Not on file  Objective:  Physical Exam: BP 114/68 (BP Location: Left Arm, Patient Position: Sitting, Cuff Size: Large)   Pulse 94   Temp 97.8 F (36.6 C) (Temporal)   Wt 134 lb 9.6 oz (61.1 kg)   SpO2 99%   BMI 22.75 kg/m   Wt Readings from Last 3 Encounters:  08/21/22 134 lb 9.6 oz (61.1 kg)  06/12/22 135 lb (61.2 kg)  05/15/22 135 lb 6.4 oz (61.4 kg)     Physical Exam Constitutional:      Appearance: Normal appearance.  HENT:     Head: Normocephalic and atraumatic.     Right Ear: Hearing, tympanic membrane, ear canal and external ear normal. There is no impacted cerumen.     Left Ear: Hearing, tympanic membrane, ear canal and external ear normal. There is no impacted cerumen.     Nose: Nose normal.  Eyes:     General: No scleral icterus.       Right eye: No discharge.        Left eye: No discharge.     Extraocular Movements: Extraocular movements intact.     Conjunctiva/sclera: Conjunctivae normal.     Pupils: Pupils are equal, round, and reactive to light.  Cardiovascular:     Rate and Rhythm: Normal rate and regular rhythm.     Heart sounds: Normal heart sounds.  Pulmonary:     Effort: Pulmonary effort is normal.     Breath sounds: Normal breath sounds.  Abdominal:     Palpations: Abdomen is soft.     Tenderness: There is no abdominal tenderness.  Skin:    General: Skin is warm.     Findings: No rash.  Neurological:     General: No focal deficit  present.     Mental Status: He is alert.     Cranial Nerves: No cranial nerve deficit.  Psychiatric:        Mood and Affect: Mood normal.        Behavior: Behavior normal.        Thought Content: Thought content normal.        Judgment: Judgment normal.     No results found.  Recent Results (from the past 2160 hour(s))  Celiac Disease Ab Screen w/Rfx     Status: None   Collection Time: 06/12/22 10:09 AM  Result Value Ref Range   Antigliadin Abs, IgA 5 0 - 19 units    Comment:                    Negative                   0 - 19                    Weak Positive             20 - 30                    Moderate to Strong Positive   >30    Transglutaminase IgA <2 0 - 3 U/mL    Comment:                               Negative        0 -  3  Weak Positive   4 - 10                               Positive           >10  Tissue Transglutaminase (tTG) has been identified  as the endomysial antigen.  Studies have demonstr-  ated that endomysial IgA antibodies have over 99%  specificity for gluten sensitive enteropathy.    IgA/Immunoglobulin A, Serum 169 90 - 386 mg/dL  Alpha-Gal Panel     Status: Abnormal   Collection Time: 06/12/22 10:09 AM  Result Value Ref Range   Beef 0.15 (H) kU/L   CLASS 0/1    Allergen, Mutton, f88 <0.10 kU/L   Class 0    Allergen, Pork, f26 0.15 (H) kU/L   CLASS 0/1    GALACTOSE-ALPHA-1,3-GALACTOSE IGE* 0.76 (H) <0.10 kU/L    Comment: Results above 0.1 kU/L indicate an allergen-specific IgE sensitization to galactose-a-1,3-galactose, and such patients are at risk for delayed allergic reactions following beef, pork, or lamb consumption. Circulating IgE antibodies may remain undetectable  despite a convincing clinical history because these antibodies may be directed towards allergens revealed or altered during industrial processing, cooking, or digestion and therefore do not exist in the original food for which the patient is  tested. Sometimes individuals diagnosed with chronic urticaria may develop IgE antibodies directed against human thyroglobulin. Such antibodies may cross-react with the bovine thyroglobulin used in ImmunoCAP(R) Allergen o215, alpha-Gal, leading to a false-positive test result. A definitive diagnosis  should be based on the evaluation of both clinical and laboratory findings and not on any single diagnostic method. Additional information can be found at [email or web address] .    Interpretation:     Status: None   Collection Time: 06/12/22 10:09 AM  Result Value Ref Range   Interpretation      Comment: . Specific                        Level of Allergen IGE Class      kU/L             Specific IGE Antibody  -----         ---------        -------------------   0              <0.10           Absent/Undetectable   0/1        0.10-0.34           Very Low Level   1          0.35-0.69           Low Level   2          0.70-3.49           Moderate Level   3          3.50-17.4           High Level   4          17.5-49.9           Very High Level   5            50-100            Very High Level   6              >100  Very High Level . The clinical relevance of allergen results of 0.10-0.34 kU/L are undetermined and intended for  specialist use. . Allergens denoted with a "**" include results using one or more analyte specific reagents. In those cases, the test was developed and its analytical performance characteristics have been determined by [employer]. It has not been cleared or approved by the U.S. Food and Drug Administration. This assay  has been v alidated pursuant to the [employer]  and is used for clinical purposes.   Osmolality, stool     Status: None   Collection Time: 06/13/22 11:33 AM  Result Value Ref Range   Osmolality, Feces CANCELED     Comment: TEST NOT PERFORMED . Formed Stool was received. Unformed stool required  for testing.  Result canceled by the ancillary.   Fecal fat, qualitative     Status: None   Collection Time: 06/13/22 11:33 AM  Result Value Ref Range   Fat Qual Neutral, Stl Normal     Comment:                                Normal (<60 Droplets/HPF)   Fat Qual Total, Stl Normal     Comment:                               Normal (<100 Droplets/HPF)  Stool, WBC/Lactoferrin     Status: None   Collection Time: 06/13/22 11:33 AM  Result Value Ref Range   MICRO NUMBER: [id number]    SPECIMEN QUALITY: Adequate    Source STOOL    STATUS: FINAL    Fecal Lactoferrin Negative    COMMENT:      Lactoferrin in the stool is a marker for fecal leukocytes and is a non-specific indicator of intestinal inflammation that may be detected in patients with acute infectious colitis or inflammatory bowel disease. The diagnosis of an acute infectious  process or active IBD cannot be established solely on the basis of a positive result. This test may not be appropriate for immunocompromised persons. In addition, this test is not FDA cleared for patients with a history of HIV and/or Hepatitis B and C,  patients with a history of infectious diarrhea (within 6 months), and patients having had a colostomy and/or ileostomy within 1 month.   Stool Culture     Status: None   Collection Time: 06/13/22 11:33 AM   Specimen: Per Rectum; Stool   Stool  Result Value Ref Range   Salmonella/Shigella Screen Final report    Stool Culture result 1 ([facility]) Comment     Comment: No Salmonella or Shigella recovered.   Campylobacter Culture Final report    Stool Culture result 1 ([facility]) Comment     Comment: No Campylobacter species isolated.   E coli, Shiga toxin Assay Negative Negative  GI Profile, Stool, PCR     Status: Abnormal   Collection Time: 06/13/22 11:33 AM  Result Value Ref Range   Campylobacter Not Detected Not Detected   C difficile toxin A/B Not Detected Not Detected   Plesiomonas shigelloides Not Detected  Not Detected   Salmonella Not Detected Not Detected   Vibrio Not Detected Not Detected   Vibrio cholerae Not Detected Not Detected   Yersinia enterocolitica Not Detected Not Detected   Enteroaggregative E coli Not Detected Not Detected   Enteropathogenic E coli Not Detected Not  Detected   Enterotoxigenic E coli Not Detected Not Detected   Shiga-toxin-producing E coli Not Detected Not Detected   E coli O157 Not applicable Not Detected   Shigella/Enteroinvasive E coli Not Detected Not Detected   Cryptosporidium Not Detected Not Detected   Cyclospora cayetanensis Not Detected Not Detected   Entamoeba histolytica Not Detected Not Detected   Giardia lamblia Not Detected Not Detected   Adenovirus F 40/41 Not Detected Not Detected   Astrovirus Not Detected Not Detected   Norovirus GI/GII Detected (A) Not Detected   Rotavirus A Not Detected Not Detected   Sapovirus Not Detected Not Detected  Fecal occult blood, imunochemical     Status: None   Collection Time: 06/13/22 11:33 AM   Specimen: Per Rectum; Stool  Result Value Ref Range   Fecal Occult Bld Negative Negative  CALPROTECTIN     Status: None   Collection Time: 06/13/22 11:33 AM   Specimen: Per Rectum; Stool  Result Value Ref Range   Calprotectin 22 mcg/g    Comment:                                       Reference Range:                                       <50     Normal                                       50-120  Borderline                                       >120    Elevated . Calprotectin in Crohn's disease and ulcerative colitis can be five to several thousand times above the reference population (50 mcg/g or less). Levels are usually 50 mcg/g or less in healthy patients and with irritable bowel syndrome. Repeat testing in 4-6 weeks is suggested for borderline values.         Garner Nash, MD, MS

## 2022-08-21 NOTE — Assessment & Plan Note (Addendum)
Plan:  Monitor symptoms and hydration status. Ensure adequate nutrition, consider multivitamin or meal supplements. Continue monitoring blood pressure.

## 2022-08-21 NOTE — Assessment & Plan Note (Signed)
Improvement noted; no need for current intervention. Discussed possible causes related to alpha-gal syndrome and chronic symptoms.

## 2022-08-21 NOTE — Assessment & Plan Note (Signed)
Continue adherence to the alpha-gal diet. Start loperamide (Imodium) as needed to manage chronic diarrhea. Suggest increasing fluid and electrolyte intake. Check CBC, electrolytes to evaluate for anemia and electrolyte imbalance. Follow-up with [facility]

## 2022-08-21 NOTE — Patient Instructions (Signed)
Stay Hydrated: Ensure you drink plenty of fluids, such as Gatorade, Pedialyte, or water with added salt, to prevent dehydration from diarrhea and sweating.  Follow the Alpha-Gal Diet: Continue to strictly adhere to the alpha-gal diet to avoid any potential triggers that could worsen your symptoms.  Take Medications as Prescribed: Use loperamide 4 milligrams, up to four times a day, as needed to help control your diarrhea.  Monitor Symptoms: Keep an eye on your blood pressure and watch for any signs of excessive dehydration or light-headedness. If you experience these symptoms, seek medical attention.  Follow-Up Care: Schedule and attend follow-up appointments with gastroenterology as recommended to address any ongoing issues related to your symptoms.

## 2022-08-25 IMAGING — DX DG CHEST 2V
2 series · 2 of 2 positions shown · non-contrast
Comparison: None.

CLINICAL DATA: Chest pain for 4 days.

EXAM:
CHEST - 2 VIEW

[chest pa]
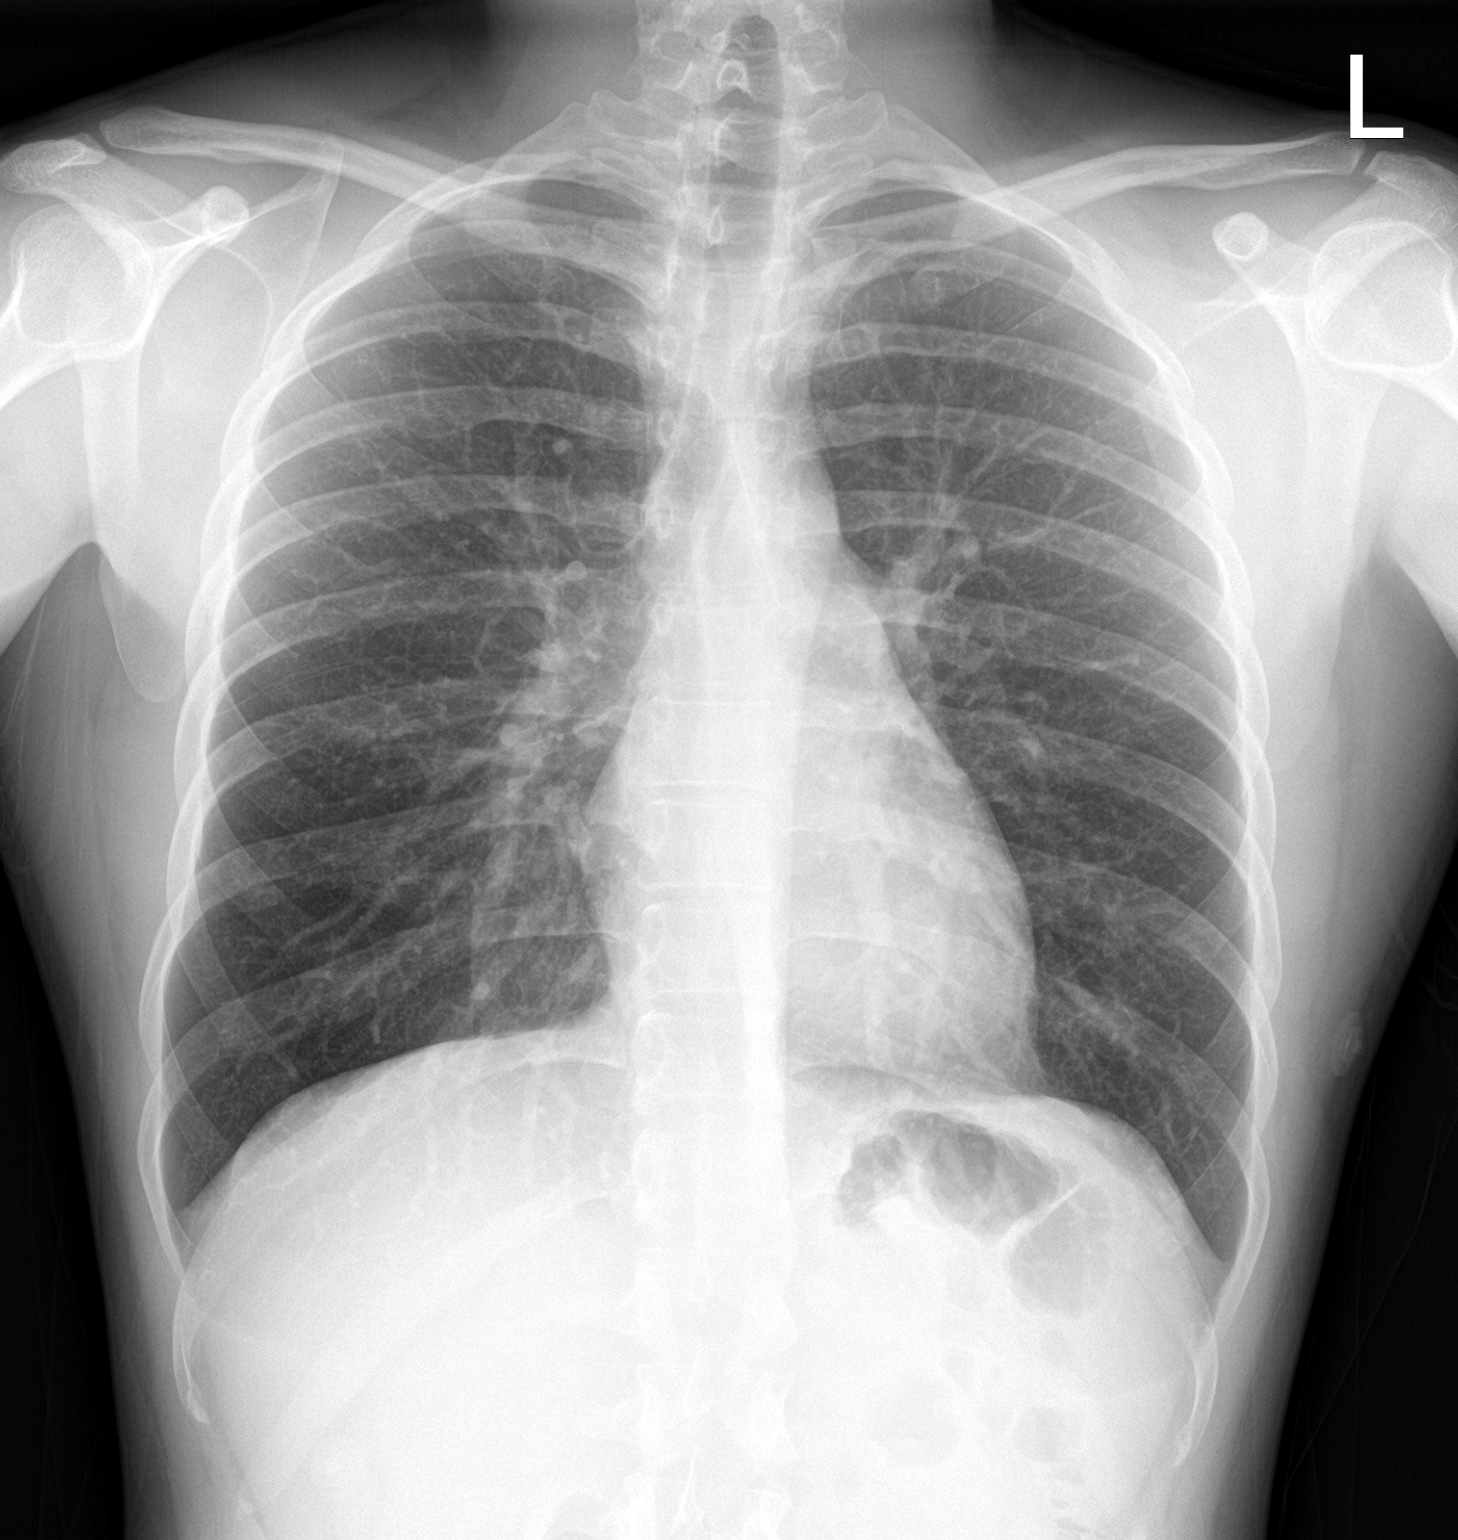

[chest lat]
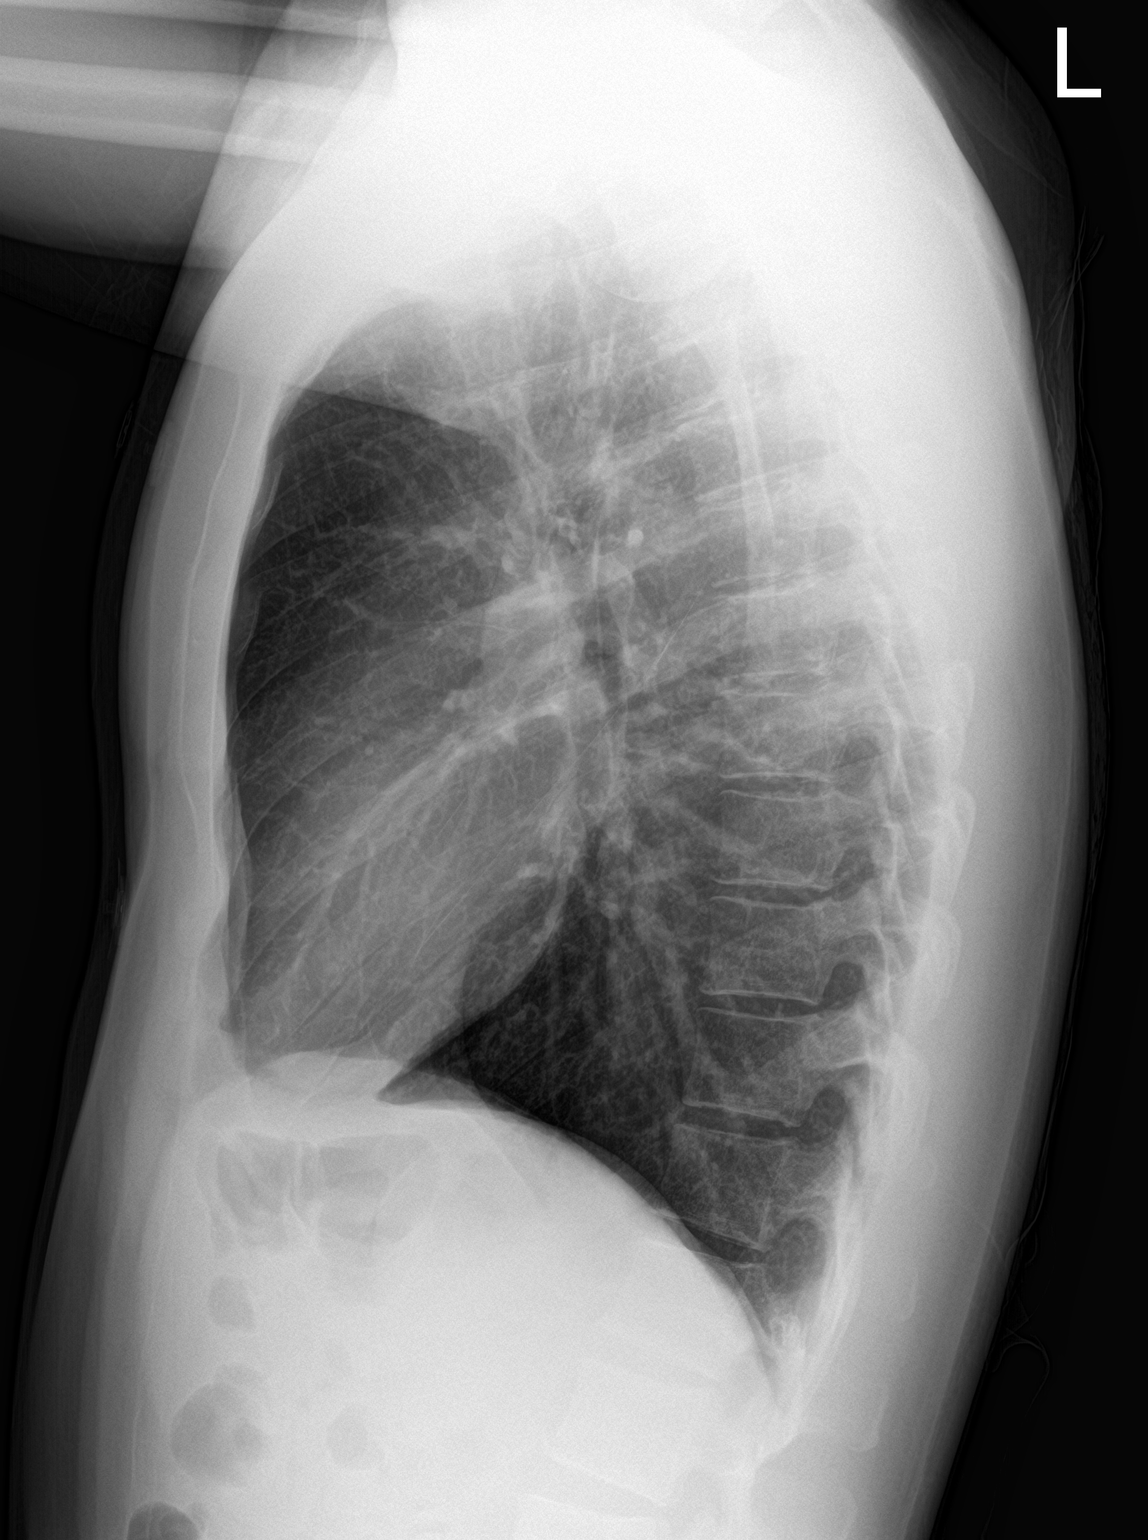

[2 of 2 positions shown; findings below may reference images not displayed]

FINDINGS: The heart size and mediastinal contours are within normal limits.
Both lungs are clear. No evidence of pneumothorax or pleural
effusion. The visualized skeletal structures are unremarkable.
IMPRESSION: Normal exam.
